# Patient Record
Sex: Female | Born: 1980
Health system: Southern US, Community
[De-identification: ages and names within clinical notes are randomized; demographics above are authoritative.]

## PROBLEM LIST (undated history)

## (undated) DIAGNOSIS — M546 Pain in thoracic spine: Secondary | ICD-10-CM

## (undated) DIAGNOSIS — S61451A Open bite of right hand, initial encounter: Secondary | ICD-10-CM

## (undated) DIAGNOSIS — N63 Unspecified lump in unspecified breast: Secondary | ICD-10-CM

## (undated) DIAGNOSIS — K219 Gastro-esophageal reflux disease without esophagitis: Secondary | ICD-10-CM

---
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
## 2013-09-20 ENCOUNTER — Ambulatory Visit
Admission: RE | Admit: 2013-09-20 | Discharge: 2013-09-20 | Disposition: A | Discharge status: Home / Self Care | Payer: No Typology Code available for payment source | Source: Ambulatory Visit | Attending: Infectious Disease | Admitting: Infectious Disease

## 2013-09-20 ENCOUNTER — Other Ambulatory Visit: Payer: Self-pay | Admitting: Infectious Disease

## 2013-09-20 DIAGNOSIS — R7612 Nonspecific reaction to cell mediated immunity measurement of gamma interferon antigen response without active tuberculosis: Secondary | ICD-10-CM

## 2016-06-25 ENCOUNTER — Ambulatory Visit: Payer: Self-pay | Admitting: Primary Care

## 2016-07-16 ENCOUNTER — Encounter: Payer: Self-pay | Admitting: Primary Care

## 2016-07-16 ENCOUNTER — Ambulatory Visit (INDEPENDENT_AMBULATORY_CARE_PROVIDER_SITE_OTHER): Payer: BLUE CROSS/BLUE SHIELD | Admitting: Primary Care

## 2016-07-16 VITALS — BP 108/76 | HR 63 | Temp 98.1°F | Ht 60.5 in | Wt 112.8 lb

## 2016-07-16 DIAGNOSIS — Z0000 Encounter for general adult medical examination without abnormal findings: Secondary | ICD-10-CM

## 2016-07-16 DIAGNOSIS — N63 Unspecified lump in unspecified breast: Secondary | ICD-10-CM

## 2016-07-16 DIAGNOSIS — Z0001 Encounter for general adult medical examination with abnormal findings: Secondary | ICD-10-CM

## 2016-07-16 NOTE — Progress Notes (Signed)
Subjective:    Patient ID: Heather Park, female    DOB: 12/31/1980, 36 y.o.   MRN: [id number]  HPI  [name] is a 36 year old female who presents today to establish care and and for complete physical.  Will obtain old records.  1) Breast Mass: Noted to left breast for years, more noticeable with menstrual periods but overall seem to be larger. She denies pain, changes in skin texture, unexplained weight loss. She has a family history of breast cancer in her paternal aunt.    Immunizations: -Tetanus: Completed within 10 years.  -Influenza: Completed in 2017   Diet: She endorses a healthy diet. Breakfast: Yogurt, coffee Lunch: Fish, vegetables, rice Dinner: Soup, rice, vegetables Snacks: Nuts Desserts: Rarely  Beverages: Coffee, water  Exercise: She does not routinely exercise Eye exam: Completed years ago, no changes in vision. Dental exam: Completed in February 2018. Pap Smear: Completed three years ago. Due.     Review of Systems  Constitutional: Negative for unexpected weight change.  HENT: Negative for rhinorrhea.   Respiratory: Negative for cough and shortness of breath.   Cardiovascular: Negative for chest pain.  Gastrointestinal: Negative for constipation and diarrhea.  Genitourinary: Negative for difficulty urinating and menstrual problem.       Breast lump on left side.  Musculoskeletal: Negative for arthralgias and myalgias.  Skin: Negative for rash.  Allergic/Immunologic: Negative for environmental allergies.  Neurological: Negative for dizziness, numbness and headaches.  Psychiatric/Behavioral:       She denies concerns for anxiety and depression.        No past medical history on file.   Social History   Social History  . Marital status: Married    Spouse name: N/A  . Number of children: N/A  . Years of education: N/A   Occupational History  . Not on file.   Social History Main Topics  . Smoking status: Never Smoker  . Smokeless  tobacco: Never Used  . Alcohol use Yes  . Drug use: Unknown  . Sexual activity: Not on file   Other Topics Concern  . Not on file   Social History Narrative   Married.   2 children.   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     Past Surgical History:  Procedure Laterality Date  . CESAREAN SECTION     x2    Family History  Problem Relation Age of Onset  . Diabetes Father   . Heart attack Maternal Grandmother   . Breast cancer Paternal Aunt   . Leukemia Maternal Uncle     No Known Allergies  No current outpatient prescriptions on file prior to visit.   No current facility-administered medications on file prior to visit.     BP 108/76   Pulse 63   Temp 98.1 F (36.7 C) (Oral)   Ht 5' 0.5" (1.537 m)   Wt 112 lb 12.8 oz (51.2 kg)   LMP 07/12/2016   SpO2 99%   BMI 21.67 kg/m    Objective:   Physical Exam  Constitutional: She is oriented to person, place, and time. She appears well-nourished.  HENT:  Right Ear: Tympanic membrane and ear canal normal.  Left Ear: Tympanic membrane and ear canal normal.  Nose: Nose normal.  Mouth/Throat: Oropharynx is clear and moist.  Eyes: Conjunctivae and EOM are normal. Pupils are equal, round, and reactive to light.  Neck: Neck supple. No thyromegaly present.  Cardiovascular: Normal rate and  regular rhythm.   No murmur heard. Pulmonary/Chest: Effort normal and breath sounds normal. She has no rales.    Masses to breasts as noted in picture. Dense breast tissue throughout.   Abdominal: Soft. Bowel sounds are normal. There is no tenderness.  Musculoskeletal: Normal range of motion.  Lymphadenopathy:    She has no cervical adenopathy.  Neurological: She is alert and oriented to person, place, and time. She has normal reflexes. No cranial nerve deficit.  Skin: Skin is warm and dry. No rash noted.  Psychiatric: She has a normal mood and affect.          Assessment & Plan:

## 2016-07-16 NOTE — Assessment & Plan Note (Signed)
Immunizations UTD. Pap due, she will reschedule as she is on her menstrual cycle and would like to hold off. Discussed the importance of a healthy diet and regular exercise in order to reduce the risk of other medical diseases. Exam with breast masses as mentioned, otherwise unremarkable. Labs pending. Follow up in 1 year for annual exam.

## 2016-07-16 NOTE — Patient Instructions (Signed)
You will be contacted regarding your mammogram and ultrasounds.  Schedule your Pap and lab work at your earliest convenience. Ensure you are fasting prior to this appointment. You may have water and black coffee.  It's important to improve your diet by increase consumption of fresh vegetables and fruits, whole grains, water.  Ensure you are drinking 64 ounces of water daily.  Start exercising. You should be getting 150 minutes of moderate intensity exercise weekly.  Follow up in 1 year for annual exam or sooner if needed.  It was a pleasure to meet you today! Please don't hesitate to call me with any questions. Welcome to [facility]!

## 2016-07-16 NOTE — Assessment & Plan Note (Signed)
Present for years, increase in size overall. Exam today with masses as noted in PE. Diagnostic mammogram and [place] ordered and are pending.

## 2016-07-16 NOTE — Progress Notes (Signed)
Pre visit review using our clinic review tool, if applicable. No additional management support is needed unless otherwise documented below in the visit note. 

## 2016-07-23 ENCOUNTER — Ambulatory Visit (INDEPENDENT_AMBULATORY_CARE_PROVIDER_SITE_OTHER): Payer: BLUE CROSS/BLUE SHIELD | Admitting: Primary Care

## 2016-07-23 ENCOUNTER — Other Ambulatory Visit (HOSPITAL_COMMUNITY)
Admission: RE | Admit: 2016-07-23 | Discharge: 2016-07-23 | Disposition: A | Discharge status: Home / Self Care | Payer: BLUE CROSS/BLUE SHIELD | Source: Ambulatory Visit | Attending: Primary Care | Admitting: Primary Care

## 2016-07-23 VITALS — BP 106/72 | HR 71 | Temp 98.1°F | Ht 61.0 in | Wt 110.1 lb

## 2016-07-23 DIAGNOSIS — Z124 Encounter for screening for malignant neoplasm of cervix: Secondary | ICD-10-CM | POA: Insufficient documentation

## 2016-07-23 DIAGNOSIS — Z0000 Encounter for general adult medical examination without abnormal findings: Secondary | ICD-10-CM | POA: Diagnosis not present

## 2016-07-23 NOTE — Progress Notes (Signed)
   Subjective:    Patient ID: Heather Park, female    DOB: May 02, 1981, 36 y.o.   MRN: [id number]  HPI  Heather Park is a 36 year old female who presents today for Pap and fasting labs. She completed a CPE on 07/16/16. She denies vaginal discharge, vaginal bleeding, urinary symptoms. She has not complaints.  Review of Systems  Gastrointestinal: Negative for abdominal pain.  Genitourinary: Negative for dysuria, pelvic pain, vaginal bleeding and vaginal discharge.       No past medical history on file.   Social History   Social History  . Marital status: Married    Spouse name: N/A  . Number of children: N/A  . Years of education: N/A   Occupational History  . Not on file.   Social History Main Topics  . Smoking status: Never Smoker  . Smokeless tobacco: Never Used  . Alcohol use Yes  . Drug use: Unknown  . Sexual activity: Not on file   Other Topics Concern  . Not on file   Social History Narrative   Married.   2 children.   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     Past Surgical History:  Procedure Laterality Date  . CESAREAN SECTION     x2    Family History  Problem Relation Age of Onset  . Diabetes Father   . Heart attack Maternal Grandmother   . Breast cancer Paternal Aunt   . Leukemia Maternal Uncle     No Known Allergies  Current Outpatient Prescriptions on File Prior to Visit  Medication Sig Dispense Refill  . Multiple Vitamin (MULTIVITAMIN) tablet Take 1 tablet by mouth daily.     No current facility-administered medications on file prior to visit.     BP 106/72   Pulse 71   Temp 98.1 F (36.7 C) (Oral)   Ht 5\' 1"  (1.549 m)   Wt 110 lb 1.9 oz (50 kg)   LMP 07/12/2016   SpO2 99%   BMI 20.81 kg/m    Objective:   Physical Exam  Constitutional: She appears well-nourished.  Cardiovascular: Normal rate.   Pulmonary/Chest: Effort normal.  Genitourinary: There is no tenderness or lesion on the right  labia. There is no tenderness or lesion on the left labia. Cervix exhibits no motion tenderness and no discharge. Right adnexum displays no tenderness. Left adnexum displays no tenderness. No vaginal discharge found.  Skin: Skin is warm and dry.          Assessment & Plan:

## 2016-07-23 NOTE — Patient Instructions (Signed)
We will notify you of your pap results once received.  Complete lab work prior to leaving today. I will notify you of your results once received.   It was a pleasure to see you today!

## 2016-07-23 NOTE — Addendum Note (Signed)
Addended by: Jacqualin Combes on: 07/23/2016 05:24 PM   Modules accepted: Orders

## 2016-07-23 NOTE — Progress Notes (Signed)
Pre visit review using our clinic review tool, if applicable. No additional management support is needed unless otherwise documented below in the visit note. 

## 2016-07-24 LAB — LIPID PANEL
CHOL/HDL RATIO: 2
Cholesterol: 176 mg/dL (ref 0–200)
HDL: 88.6 mg/dL (ref >39.00)
LDL CALC: 79 mg/dL (ref 0–99)
NONHDL: 87.22
TRIGLYCERIDES: 40 mg/dL (ref 0.0–149.0)
VLDL: 8 mg/dL (ref 0.0–40.0)

## 2016-07-24 LAB — COMPREHENSIVE METABOLIC PANEL
ALT: 16 U/L (ref 0–35)
AST: 19 U/L (ref 0–37)
Albumin: 4.5 g/dL (ref 3.5–5.2)
Alkaline Phosphatase: 46 U/L (ref 39–117)
BUN: 11 mg/dL (ref 6–23)
CHLORIDE: 103 meq/L (ref 96–112)
CO2: 31 meq/L (ref 19–32)
CREATININE: 0.75 mg/dL (ref 0.40–1.20)
Calcium: 9.7 mg/dL (ref 8.4–10.5)
GFR: 93.79 mL/min (ref >60.00)
GLUCOSE: 84 mg/dL (ref 70–99)
Potassium: 4.3 mEq/L (ref 3.5–5.1)
Sodium: 138 mEq/L (ref 135–145)
Total Bilirubin: 0.8 mg/dL (ref 0.2–1.2)
Total Protein: 7.6 g/dL (ref 6.0–8.3)

## 2016-07-25 ENCOUNTER — Ambulatory Visit
Admission: RE | Admit: 2016-07-25 | Discharge: 2016-07-25 | Disposition: A | Discharge status: Home / Self Care | Payer: BLUE CROSS/BLUE SHIELD | Source: Ambulatory Visit | Attending: Primary Care | Admitting: Primary Care

## 2016-07-25 ENCOUNTER — Encounter: Payer: Self-pay | Admitting: *Deleted

## 2016-07-25 ENCOUNTER — Other Ambulatory Visit: Payer: Self-pay | Admitting: Primary Care

## 2016-07-25 DIAGNOSIS — N63 Unspecified lump in unspecified breast: Secondary | ICD-10-CM

## 2016-07-25 DIAGNOSIS — N6310 Unspecified lump in the right breast, unspecified quadrant: Secondary | ICD-10-CM | POA: Diagnosis not present

## 2016-07-25 DIAGNOSIS — N6320 Unspecified lump in the left breast, unspecified quadrant: Secondary | ICD-10-CM | POA: Diagnosis not present

## 2016-07-25 HISTORY — DX: Unspecified lump in unspecified breast: N63.0

## 2016-07-26 ENCOUNTER — Other Ambulatory Visit: Payer: Self-pay | Admitting: Primary Care

## 2016-07-26 DIAGNOSIS — R928 Other abnormal and inconclusive findings on diagnostic imaging of breast: Secondary | ICD-10-CM

## 2016-07-26 DIAGNOSIS — N6320 Unspecified lump in the left breast, unspecified quadrant: Secondary | ICD-10-CM

## 2016-07-26 DIAGNOSIS — N6310 Unspecified lump in the right breast, unspecified quadrant: Secondary | ICD-10-CM

## 2016-07-26 LAB — CYTOLOGY - PAP
Adequacy: ABSENT
Diagnosis: NEGATIVE
HPV: NOT DETECTED

## 2016-07-29 ENCOUNTER — Encounter: Payer: Self-pay | Admitting: *Deleted

## 2016-11-08 ENCOUNTER — Telehealth: Payer: Self-pay | Admitting: Primary Care

## 2016-11-08 DIAGNOSIS — N6459 Other signs and symptoms in breast: Secondary | ICD-10-CM

## 2016-11-08 NOTE — Telephone Encounter (Signed)
Heather Park, please notify patient that we will work on taking care of the referral. [name], is this a general surgery referral?

## 2016-11-08 NOTE — Telephone Encounter (Signed)
Patient was suppose to get a Breast Biopsy done in March.  [facility] said [facility] too [facility] for her to have it done there.  Patient would like a referral sent to [facility].  Patient would like an appointment on 12/24/16.  Patient said if that's not available, she can go on 12/31/16.  She'd like the appointment early in the morning. Patient only wants a left breast biopsy.

## 2016-11-11 NOTE — Telephone Encounter (Signed)
Spoken and notified patient of Kate's comments. Patient verbalized understanding. 

## 2016-11-11 NOTE — Telephone Encounter (Signed)
Referral placed.

## 2016-11-11 NOTE — Addendum Note (Signed)
Addended by: [name] on: 11/11/2016 04:56 PM   Modules accepted: Orders

## 2016-11-11 NOTE — Telephone Encounter (Signed)
Yes Its General Surgery

## 2016-11-12 ENCOUNTER — Telehealth: Payer: Self-pay | Admitting: General Surgery

## 2016-11-12 NOTE — Telephone Encounter (Signed)
LEFT MESSAGE FOR PATIENT TO CALL & SCHEDULE AN APPOINTMENT WITH DR Marjorie Smolder CALL) FOR CAT 4 MAMMO&U/S DONE 07-25-16 @ [facility].REF'D BY Alma Friendly NP(DR K BEDSOLE).PLEASE MAKE SURE PATIENT IS AWARE THIS IS FOR EVAL ONLY UNLESS DETERMINED BY MD.

## 2016-11-15 ENCOUNTER — Encounter: Payer: Self-pay | Admitting: *Deleted

## 2016-11-28 ENCOUNTER — Ambulatory Visit: Payer: Self-pay | Admitting: General Surgery

## 2016-12-19 ENCOUNTER — Telehealth: Payer: Self-pay | Admitting: *Deleted

## 2016-12-19 NOTE — Telephone Encounter (Signed)
Pt left voicemail at Triage. She said she has been feeling really fatigued lately, pt is requesting labs done to see if there is a cause for her fatigue. Pt is requesting to have a CBC and TSH lab done

## 2016-12-19 NOTE — Telephone Encounter (Signed)
Please notify patient that we are sorry to hear that she's feeling so fatigued. We will need to see her in the office for an evaluation and for any labs. We may need to add on some extra labs based off of her visit. Please schedule her at her convenience.

## 2016-12-20 NOTE — Telephone Encounter (Signed)
Spoken and notified patient of Kate's comments. Patient wanted to wait until she get her biopsy results before she see Anda Kraft.  However, patient stated that she wanted to know if it is okay with Anda Kraft to do blood work before she see her. Patient wants discuss the results with Anda Kraft.  Patient is aware that Anda Kraft is out of the office until next week.

## 2016-12-24 ENCOUNTER — Ambulatory Visit (INDEPENDENT_AMBULATORY_CARE_PROVIDER_SITE_OTHER): Payer: BLUE CROSS/BLUE SHIELD | Admitting: General Surgery

## 2016-12-24 ENCOUNTER — Encounter: Payer: Self-pay | Admitting: General Surgery

## 2016-12-24 VITALS — BP 94/62 | HR 82 | Resp 12 | Ht 60.0 in | Wt 113.0 lb

## 2016-12-24 DIAGNOSIS — D242 Benign neoplasm of left breast: Secondary | ICD-10-CM

## 2016-12-24 DIAGNOSIS — N6323 Unspecified lump in the left breast, lower outer quadrant: Secondary | ICD-10-CM

## 2016-12-24 NOTE — Telephone Encounter (Signed)
Spoken and notified patient of Kate's comments. Patient verbalized understanding.  Patient will call back to schedule the appt with Anda Kraft

## 2016-12-24 NOTE — Telephone Encounter (Signed)
Please notify patient that we will complete labs during her visit in the office. Again, I need to see her in the office in order to determine what labs we may require.

## 2016-12-24 NOTE — Telephone Encounter (Signed)
Message left for patient to return my call.  

## 2016-12-24 NOTE — Patient Instructions (Addendum)
The patient is aware to call back for any questions or concerns. Try to find out if Aunt was genetically tested (BRCA).

## 2016-12-24 NOTE — Progress Notes (Signed)
Patient ID: Heather Park, female   DOB: [birth year], 36 y.o.   MRN: [id number]  Chief Complaint  Patient presents with  . Breast Problem    HPI Heather Park is a 36 y.o. female.  who presents for a breast evaluation referred by Alma Friendly NP.  She states she started having intermittent left breast pain since last year. She can feel a knot in the lower left breast as well. She states that it started out the size of a dime and now she can hardly feel it. She states the pain started getting better since January with the herbal medications (from [place]) and vitamin C.  Mammograms with left and right breast ultrasounds done 07-25-16 which showed a right breast mass, which she can not feel. Patient does perform regular self breast checks here recently.   Inverted nipples until she breast fed. She is currently on Amoxil since Monday for tooth pain. She works as a Marine scientist at [employer].  HPI  Past Medical History:  Diagnosis Date  . Breast mass 07/25/2016   palp lump left breast 4 o'clock left breast x 3 years-increases in size with periods    Past Surgical History:  Procedure Laterality Date  . CESAREAN SECTION     x2    Family History  Problem Relation Age of Onset  . Diabetes Father   . Heart attack Maternal Grandmother   . Breast cancer Paternal Aunt 8  . Leukemia Maternal Uncle   . Prostate cancer Paternal Uncle     Social History Social History  Substance Use Topics  . Smoking status: Never Smoker  . Smokeless tobacco: Never Used  . Alcohol use Yes    No Known Allergies  Current Outpatient Prescriptions  Medication Sig Dispense Refill  . amoxicillin (AMOXIL) 500 MG tablet     . Ascorbic Acid (VITAMIN C) 100 MG tablet Take 500 mg by mouth daily.    Marland Kitchen b complex vitamins capsule Take 1 capsule by mouth daily.    Marland Kitchen CALCIUM-MAGNESIUM-ZINC PO Take by mouth daily.    . Multiple Vitamin (MULTIVITAMIN) tablet Take 1 tablet by mouth daily.     No current  facility-administered medications for this visit.     Review of Systems Review of Systems  Constitutional: Negative.   Respiratory: Negative.   Cardiovascular: Negative.     Blood pressure 94/62, pulse 82, resp. rate 12, height 5' (1.524 m), weight 113 lb (51.3 kg), last menstrual period 12/02/2016.  Physical Exam Physical Exam  Constitutional: She is oriented to person, place, and time. She appears well-developed and well-nourished.  HENT:  Mouth/Throat: Oropharynx is clear and moist.  Eyes: Conjunctivae are normal. No scleral icterus.  Neck: Neck supple.  Cardiovascular: Normal rate, regular rhythm and normal heart sounds.   Pulmonary/Chest: Effort normal and breath sounds normal. Right breast exhibits no inverted nipple, no mass, no nipple discharge, no skin change and no tenderness. Left breast exhibits mass. Left breast exhibits no inverted nipple, no nipple discharge, no skin change and no tenderness.    4 o'clock 6 CFN left breast a 1.2 cm nodule  Lymphadenopathy:    She has no cervical adenopathy.    She has no axillary adenopathy.  Neurological: She is alert and oriented to person, place, and time.  Skin: Skin is warm and dry.  Psychiatric: Her behavior is normal.    Data Reviewed Bilateral diagnostic mammograms and bilateral breast ultrasounds dated 07/25/2016 were reviewed.  Left breast an area of palpable mass showed  a 0.8 x 1.6 x 2.4 cm multilobulated hypoechoic mass thought to represent a fibroadenoma.  Right breast ultrasound in the area of mammographic abnormality showed a 3 x 4 x 4 mm lesion ought to represent a complicated cyst versus fibroadenoma. BI-RADS-4.  Assessment    Long-standing left lower outer quadrant breast mass by patient history, significant decrease in size and resolution of tenderness over the last 6 months with herbal medication.  Small mammographic/ultrasound detected lesion of the right breast, nonpalpable.  The radiology report  stated the patient's preference was for biopsy at the time of her evaluation in March.    Plan    Options for management reviewed: 1 ) core biopsy to confirm benign pathology versus 2) excisional biopsy was superficial location to remove the lesion versus 3) observation.  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 Based on review of the ultrasound and her clinical exam, my suspicion for occult malignancy is small, but the appropriateness of reassessment if biopsy is not completed was reviewed.  The small lesion in the right breast can be followed at the same setting with a repeat ultrasound.    Discussed options Follow up in 9 months with office ultrasound. Try to find out if Aunt was genetically tested (BRCA). The patient is aware to call back for any questions or new concerns.   HPI, Physical Exam, Assessment and Plan have been scribed under the direction and in the presence of Robert Bellow, MD. Karie Fetch, RN  I have completed the exam and reviewed the above documentation for accuracy and completeness.  I agree with the above.  Haematologist has been used and any errors in dictation or transcription are unintentional.  Hervey Ard, M.D., F.A.C.S.  Robert Bellow 12/25/2016, 4:43 PM

## 2016-12-25 DIAGNOSIS — D242 Benign neoplasm of left breast: Secondary | ICD-10-CM | POA: Insufficient documentation

## 2017-06-18 ENCOUNTER — Encounter: Payer: Self-pay | Admitting: Primary Care

## 2017-06-18 ENCOUNTER — Ambulatory Visit (INDEPENDENT_AMBULATORY_CARE_PROVIDER_SITE_OTHER): Payer: BLUE CROSS/BLUE SHIELD | Admitting: Primary Care

## 2017-06-18 VITALS — BP 100/58 | HR 88 | Temp 98.3°F | Ht 61.0 in | Wt 116.8 lb

## 2017-06-18 DIAGNOSIS — R5383 Other fatigue: Secondary | ICD-10-CM | POA: Insufficient documentation

## 2017-06-18 LAB — COMPREHENSIVE METABOLIC PANEL
ALBUMIN: 4.4 g/dL (ref 3.5–5.2)
ALK PHOS: 48 U/L (ref 39–117)
ALT: 13 U/L (ref 0–35)
AST: 18 U/L (ref 0–37)
BILIRUBIN TOTAL: 0.6 mg/dL (ref 0.2–1.2)
BUN: 13 mg/dL (ref 6–23)
CO2: 29 mEq/L (ref 19–32)
Calcium: 9.5 mg/dL (ref 8.4–10.5)
Chloride: 105 mEq/L (ref 96–112)
Creatinine, Ser: 0.78 mg/dL (ref 0.40–1.20)
GFR: 89.17 mL/min (ref >60.00)
Glucose, Bld: 108 mg/dL — ABNORMAL HIGH (ref 70–99)
Potassium: 4.5 mEq/L (ref 3.5–5.1)
Sodium: 139 mEq/L (ref 135–145)
TOTAL PROTEIN: 7.8 g/dL (ref 6.0–8.3)

## 2017-06-18 LAB — CBC
HEMATOCRIT: 38.9 % (ref 36.0–46.0)
HEMOGLOBIN: 12.7 g/dL (ref 12.0–15.0)
MCHC: 32.7 g/dL (ref 30.0–36.0)
MCV: 82.2 fl (ref 78.0–100.0)
PLATELETS: 171 10*3/uL (ref 150.0–400.0)
RBC: 4.73 Mil/uL (ref 3.87–5.11)
RDW: 12.9 % (ref 11.5–15.5)
WBC: 6.7 10*3/uL (ref 4.0–10.5)

## 2017-06-18 LAB — VITAMIN D 25 HYDROXY (VIT D DEFICIENCY, FRACTURES): VITD: 32.31 ng/mL (ref 30.00–100.00)

## 2017-06-18 LAB — VITAMIN B12: Vitamin B-12: 709 pg/mL (ref 211–911)

## 2017-06-18 LAB — IBC PANEL
IRON: 144 ug/dL (ref 42–145)
Saturation Ratios: 41.3 % (ref 20.0–50.0)
Transferrin: 249 mg/dL (ref 212.0–360.0)

## 2017-06-18 LAB — TSH: TSH: 0.79 u[IU]/mL (ref 0.35–4.50)

## 2017-06-18 NOTE — Assessment & Plan Note (Signed)
Present for the past one year, improved after taking vitamins OTC. Seems like she's sleeping okay during the night.   Exam today unremarkable.  Check labs including TSH, CBC, Iron panel, CMP, vitamin D and B12.  Consider evaluation for depression if labs negative.

## 2017-06-18 NOTE — Patient Instructions (Signed)
Stop by the lab prior to leaving today. I will notify you of your results once received.   It was a pleasure to see you today!  

## 2017-06-18 NOTE — Progress Notes (Signed)
Subjective:    Patient ID: Heather Park, female    DOB: [birth year], 37 y.o.   MRN: [id number]  HPI  Heather Park is a 37 year old female with a history of anemia who presents today with a chief complaint of fatigue.   Symptoms have been present for the past one year. She has been taking oral iron for about one year since symptoms began. Over the past two months she's noticed increased fatigue, palpitations, dizziness in the morning when waking, pale skin, cold hands and legs. She's since started taking D53, Folic acid, Vitamin C in addition to her Iron. She did start to improve after adding in folic acid, b 12, and vitamin C.   She denies heavy menstrual periods, hair loss, increased stress, chest pain, snoring, shortness of breath. She works as a Marine scientist doing day shift. Later this months she'll be working day shift at one job for three days, then night shift at her new job for three nights. She'll be workingtwo jobs for a total of two months. She's sleeping 7 hours nightly, will wake once during the night on average.   BP Readings from Last 3 Encounters:  06/18/17 (!) 100/58  12/24/16 94/62  07/23/16 106/72     Review of Systems  Constitutional: Positive for fatigue. Negative for fever and unexpected weight change.  Respiratory: Negative for shortness of breath.   Cardiovascular: Positive for palpitations. Negative for chest pain.  Neurological: Positive for dizziness. Negative for syncope and headaches.       Past Medical History:  Diagnosis Date  . Breast mass 07/25/2016   palp lump left breast 4 o'clock left breast x 3 years-increases in size with periods     Social History   Socioeconomic History  . Marital status: Married    Spouse name: Not on file  . Number of children: Not on file  . Years of education: Not on file  . Highest education level: Not on file  Social Needs  . Financial resource strain: Not on file  . Food insecurity - worry: Not on file  . Food  insecurity - inability: Not on file  . Transportation needs - medical: Not on file  . Transportation needs - non-medical: Not on file  Occupational History  . Not on file  Tobacco Use  . Smoking status: Never Smoker  . Smokeless tobacco: Never Used  Substance and Sexual Activity  . Alcohol use: Yes  . Drug use: No  . Sexual activity: Not on file  Other Topics Concern  . Not on file  Social History Narrative   Married.   2 children.   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     Past Surgical History:  Procedure Laterality Date  . CESAREAN SECTION     x2    Family History  Problem Relation Age of Onset  . Diabetes Father   . Heart attack Maternal Grandmother   . Breast cancer Paternal Aunt 40  . Leukemia Maternal Uncle   . Prostate cancer Paternal Uncle     No Known Allergies  Current Outpatient Medications on File Prior to Visit  Medication Sig Dispense Refill  . Ascorbic Acid (VITAMIN C) 100 MG tablet Take 100 mg by mouth daily.     Marland Kitchen b complex vitamins capsule Take 1 capsule by mouth daily.    Marland Kitchen CALCIUM-MAGNESIUM-ZINC PO Take by mouth daily.    . ferrous sulfate 325 (65 FE) MG EC  tablet Take 325 mg by mouth 3 (three) times daily with meals.    Marland Kitchen FOLIC ACID PO Take by mouth.    . Multiple Vitamin (MULTIVITAMIN) tablet Take 1 tablet by mouth daily.     No current facility-administered medications on file prior to visit.     BP (!) 100/58   Pulse 88   Temp 98.3 F (36.8 C) (Oral)   Ht 5\' 1"  (1.549 m)   Wt 116 lb 12 oz (53 kg)   LMP 06/01/2017   SpO2 98%   BMI 22.06 kg/m    Objective:   Physical Exam  Constitutional: She appears well-nourished.  Neck: Neck supple. No thyromegaly present.  Cardiovascular: Normal rate and regular rhythm.  Pulmonary/Chest: Effort normal and breath sounds normal.  Skin: Skin is warm and dry.  Psychiatric: She has a normal mood and affect.          Assessment & Plan:

## 2017-06-25 ENCOUNTER — Other Ambulatory Visit: Payer: BLUE CROSS/BLUE SHIELD

## 2017-06-25 ENCOUNTER — Encounter: Payer: BLUE CROSS/BLUE SHIELD | Admitting: Primary Care

## 2017-07-11 ENCOUNTER — Other Ambulatory Visit: Payer: Self-pay | Admitting: Internal Medicine

## 2017-07-11 ENCOUNTER — Ambulatory Visit
Admission: RE | Admit: 2017-07-11 | Discharge: 2017-07-11 | Disposition: A | Discharge status: Home / Self Care | Payer: No Typology Code available for payment source | Source: Ambulatory Visit | Attending: Internal Medicine | Admitting: Internal Medicine

## 2017-07-11 DIAGNOSIS — A150 Tuberculosis of lung: Secondary | ICD-10-CM

## 2017-09-23 ENCOUNTER — Ambulatory Visit: Payer: BLUE CROSS/BLUE SHIELD | Admitting: General Surgery

## 2017-10-15 ENCOUNTER — Encounter: Payer: Self-pay | Admitting: *Deleted

## 2017-12-29 ENCOUNTER — Telehealth: Payer: Self-pay

## 2017-12-29 NOTE — Telephone Encounter (Signed)
I spoke with Heather Park; Heather Park will come fasting on 01/29/18 and talk with Gentry Fitz NP about possibly doing Vit D levels and cholesterol test.

## 2017-12-29 NOTE — Telephone Encounter (Signed)
PLEASE NOTE: All timestamps contained within this report are represented as [place] Standard Time. CONFIDENTIALTY NOTICE: This fax transmission is intended only for the addressee. It contains information that is legally privileged, confidential or otherwise protected from use or disclosure. If you are not the intended recipient, you are strictly prohibited from reviewing, disclosing, copying using or disseminating any of this information or taking any action in reliance on or regarding this information. If you have received this fax in error, please notify us immediately by telephone so that we can arrange for its return to [place]. Phone: [phone], Toll-Free: [phone], Fax: [phone] Page: 1 of 1 Call Id: [id number] [place] Night - Client Nonclinical Telephone Record [place] Night - Client Client Site [facility] Physician Alma Friendly - NP Contact Type Call Who Is Calling Patient / Member / Family / Caregiver Caller Name [name] Phone Number [phone] Patient Name Heather Park Patient DOB Sep 13, 1980 Call Type Message Only Information Provided Reason for Call Request for General Office Information Initial Comment Caller states has appointment on 9/26 at 8:20 for routine check up. Wants to verify whether blood work needs to be done before. Additional Comment Request call back Call Closed By: Margaretmary Bayley Transaction Date/Time: 12/27/2017 11:26:03 AM (ET)

## 2018-01-29 ENCOUNTER — Encounter: Payer: Self-pay | Admitting: Primary Care

## 2018-01-29 ENCOUNTER — Ambulatory Visit (INDEPENDENT_AMBULATORY_CARE_PROVIDER_SITE_OTHER): Payer: 59 | Admitting: Primary Care

## 2018-01-29 VITALS — BP 96/60 | HR 63 | Temp 97.7°F | Ht 61.0 in | Wt 111.2 lb

## 2018-01-29 DIAGNOSIS — Z0000 Encounter for general adult medical examination without abnormal findings: Secondary | ICD-10-CM

## 2018-01-29 DIAGNOSIS — R5383 Other fatigue: Secondary | ICD-10-CM

## 2018-01-29 DIAGNOSIS — M2550 Pain in unspecified joint: Secondary | ICD-10-CM

## 2018-01-29 LAB — BASIC METABOLIC PANEL
BUN: 12 mg/dL (ref 6–23)
CALCIUM: 9.5 mg/dL (ref 8.4–10.5)
CHLORIDE: 105 meq/L (ref 96–112)
CO2: 29 meq/L (ref 19–32)
CREATININE: 0.78 mg/dL (ref 0.40–1.20)
GFR: 88.86 mL/min (ref >60.00)
Glucose, Bld: 96 mg/dL (ref 70–99)
Potassium: 4.5 mEq/L (ref 3.5–5.1)
Sodium: 139 mEq/L (ref 135–145)

## 2018-01-29 LAB — CBC
HCT: 36.8 % (ref 36.0–46.0)
Hemoglobin: 12.2 g/dL (ref 12.0–15.0)
MCHC: 33.2 g/dL (ref 30.0–36.0)
MCV: 80.5 fl (ref 78.0–100.0)
Platelets: 162 10*3/uL (ref 150.0–400.0)
RBC: 4.57 Mil/uL (ref 3.87–5.11)
RDW: 12.9 % (ref 11.5–15.5)
WBC: 5.9 10*3/uL (ref 4.0–10.5)

## 2018-01-29 LAB — VITAMIN D 25 HYDROXY (VIT D DEFICIENCY, FRACTURES): VITD: 39.59 ng/mL (ref 30.00–100.00)

## 2018-01-29 LAB — LIPID PANEL
CHOL/HDL RATIO: 2
Cholesterol: 162 mg/dL (ref 0–200)
HDL: 71.3 mg/dL (ref >39.00)
LDL Cholesterol: 75 mg/dL (ref 0–99)
NONHDL: 90.46
Triglycerides: 75 mg/dL (ref 0.0–149.0)
VLDL: 15 mg/dL (ref 0.0–40.0)

## 2018-01-29 LAB — URIC ACID: Uric Acid, Serum: 4.7 mg/dL (ref 2.4–7.0)

## 2018-01-29 NOTE — Assessment & Plan Note (Signed)
Chronic to hands, knees, back. Discussed daily stretching. Check uric acid (per patient request). No joint swelling or erythema, doubt gout and RA.

## 2018-01-29 NOTE — Progress Notes (Signed)
Subjective:    Patient ID: Heather Park, female    DOB: 21-Jun-1980, 37 y.o.   MRN: [id number]  HPI  Heather Park is a 37 year old female who presents today for complete physical.  Overall doing well. Does continue to have intermittent fatigue. She is compliant to her vitamin D 1000 units daily. She has noticed arthralgias to her hands, knees, and back. She does have a family history of arthritis. She is concerned about gout and would like her levels checked. She denies joint swelling/erythema.   Immunizations: -Tetanus: Completed within 10 years.  -Influenza: Will get at work   Diet: She endorses a healthy diet Breakfast: Yogurt Lunch: Protein, vegetable, starch Dinner: Protein, vegetables, starch Snacks: None Desserts: Daily Beverages: Coffee, milk, rarely, water  Exercise: She is not exercise Eye exam: No recent exam Dental exam: Completes annually  Pap Smear: Completed in 2018, has copper IUD x 4 years.   BP Readings from Last 3 Encounters:  01/29/18 96/60  06/18/17 (!) 100/58  12/24/16 94/62     Review of Systems  Constitutional: Negative for unexpected weight change.       Intermittent fatigue  HENT: Negative for rhinorrhea.   Respiratory: Negative for cough and shortness of breath.   Cardiovascular: Negative for chest pain.  Gastrointestinal: Negative for constipation and diarrhea.  Genitourinary: Negative for difficulty urinating and menstrual problem.  Musculoskeletal: Positive for arthralgias. Negative for joint swelling.       Chronic intermittent upper back tightness  Skin: Negative for color change and rash.  Allergic/Immunologic: Negative for environmental allergies.  Neurological: Negative for dizziness, numbness and headaches.  Psychiatric/Behavioral: The patient is not nervous/anxious.        Past Medical History:  Diagnosis Date  . Breast mass 07/25/2016   palp lump left breast 4 o'clock left breast x 3 years-increases in size with periods       Social History   Socioeconomic History  . Marital status: Married    Spouse name: Not on file  . Number of children: Not on file  . Years of education: Not on file  . Highest education level: Not on file  Occupational History  . Not on file  Social Needs  . Financial resource strain: Not on file  . Food insecurity:    Worry: Not on file    Inability: Not on file  . Transportation needs:    Medical: Not on file    Non-medical: Not on file  Tobacco Use  . Smoking status: Never Smoker  . Smokeless tobacco: Never Used  Substance and Sexual Activity  . Alcohol use: Yes  . Drug use: No  . Sexual activity: Not on file  Lifestyle  . Physical activity:    Days per week: Not on file    Minutes per session: Not on file  . Stress: Not on file  Relationships  . Social connections:    Talks on phone: Not on file    Gets together: Not on file    Attends religious service: Not on file    Active member of club or organization: Not on file    Attends meetings of clubs or organizations: Not on file    Relationship status: Not on file  . Intimate partner violence:    Fear of current or ex partner: Not on file    Emotionally abused: Not on file    Physically abused: Not on file    Forced sexual activity: Not on file  Other  Topics Concern  . Not on file  Social History Narrative   Married.   2 children.   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     Past Surgical History:  Procedure Laterality Date  . CESAREAN SECTION     x2    Family History  Problem Relation Age of Onset  . Diabetes Father   . Heart attack Maternal Grandmother   . Breast cancer Paternal Aunt 1  . Leukemia Maternal Uncle   . Prostate cancer Paternal Uncle     No Known Allergies  Current Outpatient Medications on File Prior to Visit  Medication Sig Dispense Refill  . Ascorbic Acid (VITAMIN C) 100 MG tablet Take 100 mg by mouth daily.     Marland Kitchen b complex vitamins  capsule Take 1 capsule by mouth daily.    Marland Kitchen CALCIUM-MAGNESIUM-ZINC PO Take by mouth daily.    . ferrous sulfate 325 (65 FE) MG EC tablet Take 325 mg by mouth 3 (three) times daily with meals.    Marland Kitchen FOLIC ACID PO Take by mouth.    . Multiple Vitamin (MULTIVITAMIN) tablet Take 1 tablet by mouth daily.     No current facility-administered medications on file prior to visit.     BP 96/60   Pulse 63   Temp 97.7 F (36.5 C) (Oral)   Ht 5\' 1"  (1.549 m)   Wt 111 lb 4 oz (50.5 kg)   LMP 01/11/2018   SpO2 99%   BMI 21.02 kg/m    Objective:   Physical Exam  Constitutional: She is oriented to person, place, and time. She appears well-nourished.  HENT:  Mouth/Throat: No oropharyngeal exudate.  Eyes: Pupils are equal, round, and reactive to light. EOM are normal.  Neck: Neck supple. No thyromegaly present.  Cardiovascular: Normal rate and regular rhythm.  Respiratory: Effort normal and breath sounds normal.  GI: Soft. Bowel sounds are normal. There is no tenderness.  Musculoskeletal: Normal range of motion.  Neurological: She is alert and oriented to person, place, and time.  Skin: Skin is warm and dry.  Psychiatric: She has a normal mood and affect.           Assessment & Plan:

## 2018-01-29 NOTE — Assessment & Plan Note (Signed)
Td UTD per patient. She will get the influenza vaccination at work. Commended her on an overall healthy diet. Recommended regular exercise for symptoms of fatigue. Exam unremarkable. Labs pending. Follow up in 1 year for CPE.

## 2018-01-29 NOTE — Patient Instructions (Signed)
Continue to work on [employer].  Start exercising. You should be getting 150 minutes of moderate intensity exercise weekly.  Ensure you are consuming 64 ounces of water daily.  Stop by the lab prior to leaving today. I will notify you of your results once received.   It was a pleasure to see you today!   Preventive Care 18-39 Years, Female Preventive care refers to lifestyle choices and visits with your health care provider that can promote health and wellness. What does preventive care include?  A yearly physical exam. This is also called an annual well check.  Dental exams once or twice a year.  Routine eye exams. Ask your health care provider how often you should have your eyes checked.  Personal lifestyle choices, including: ? Daily care of your teeth and gums. ? Regular physical activity. ? Eating a healthy diet. ? Avoiding tobacco and drug use. ? Limiting alcohol use. ? Practicing safe sex. ? Taking vitamin and mineral supplements as recommended by your health care provider. What happens during an annual well check? The services and screenings done by your health care provider during your annual well check will depend on your age, overall health, lifestyle risk factors, and family history of disease. Counseling Your health care provider may ask you questions about your:  Alcohol use.  Tobacco use.  Drug use.  Emotional well-being.  Home and relationship well-being.  Sexual activity.  Eating habits.  Work and work Statistician.  Method of birth control.  Menstrual cycle.  Pregnancy history.  Screening You may have the following tests or measurements:  Height, weight, and BMI.  Diabetes screening. This is done by checking your blood sugar (glucose) after you have not eaten for a while (fasting).  Blood pressure.  Lipid and cholesterol levels. These may be checked every 5 years starting at age 68.  Skin check.  Hepatitis C blood test.  Hepatitis  B blood test.  Sexually transmitted disease (STD) testing.  BRCA-related cancer screening. This may be done if you have a family history of breast, ovarian, tubal, or peritoneal cancers.  Pelvic exam and Pap test. This may be done every 3 years starting at age 35. Starting at age 53, this may be done every 5 years if you have a Pap test in combination with an HPV test.  Discuss your test results, treatment options, and if necessary, the need for more tests with your health care provider. Vaccines Your health care provider may recommend certain vaccines, such as:  Influenza vaccine. This is recommended every year.  Tetanus, diphtheria, and acellular pertussis (Tdap, Td) vaccine. You may need a Td booster every 10 years.  Varicella vaccine. You may need this if you have not been vaccinated.  HPV vaccine. If you are 48 or younger, you may need three doses over 6 months.  Measles, mumps, and rubella (MMR) vaccine. You may need at least one dose of MMR. You may also need a second dose.  Pneumococcal 13-valent conjugate (PCV13) vaccine. You may need this if you have certain conditions and were not previously vaccinated.  Pneumococcal polysaccharide (PPSV23) vaccine. You may need one or two doses if you smoke cigarettes or if you have certain conditions.  Meningococcal vaccine. One dose is recommended if you are age 17-21 years and a first-year college student living in a residence hall, or if you have one of several medical conditions. You may also need additional booster doses.  Hepatitis A vaccine. You may need this if you  have certain conditions or if you travel or work in places where you may be exposed to hepatitis A.  Hepatitis B vaccine. You may need this if you have certain conditions or if you travel or work in places where you may be exposed to hepatitis B.  Haemophilus influenzae type b (Hib) vaccine. You may need this if you have certain risk factors.  Talk to your health care  provider about which screenings and vaccines you need and how often you need them. This information is not intended to replace advice given to you by your health care provider. Make sure you discuss any questions you have with your health care provider. Document Released: 06/18/2001 Document Revised: 01/10/2016 Document Reviewed: 02/21/2015 Elsevier Interactive Patient Education  [employer].

## 2018-01-29 NOTE — Assessment & Plan Note (Signed)
Intermittent, somewhat improved. Repeat CBC and Vitamin D pending.

## 2018-02-18 ENCOUNTER — Telehealth: Payer: Self-pay | Admitting: Primary Care

## 2018-02-18 NOTE — Telephone Encounter (Signed)
Copied from [place] [id number]. Topic: Quick Communication - See Telephone Encounter >> Feb 18, 2018 10:19 AM Rutherford Nail, NT wrote: CRM for notification. See Telephone encounter for: 02/18/18. Patient would like results from 01/29/18 mailed to her. Would also like to know if it can include a mychart activation code. Please advise. Patient states that she does not have mychart at this time.

## 2018-02-20 ENCOUNTER — Encounter: Payer: Self-pay | Admitting: *Deleted

## 2018-02-20 ENCOUNTER — Encounter (INDEPENDENT_AMBULATORY_CARE_PROVIDER_SITE_OTHER): Payer: Self-pay

## 2018-02-20 NOTE — Telephone Encounter (Signed)
Noted. Mailed out for patient as requested.

## 2018-05-08 ENCOUNTER — Ambulatory Visit
Admission: EM | Admit: 2018-05-08 | Discharge: 2018-05-08 | Disposition: A | Discharge status: Home / Self Care | Payer: No Typology Code available for payment source | Attending: Family Medicine | Admitting: Family Medicine

## 2018-05-08 ENCOUNTER — Other Ambulatory Visit: Payer: Self-pay

## 2018-05-08 ENCOUNTER — Encounter: Payer: Self-pay | Admitting: Emergency Medicine

## 2018-05-08 DIAGNOSIS — M546 Pain in thoracic spine: Secondary | ICD-10-CM | POA: Diagnosis not present

## 2018-05-08 DIAGNOSIS — T148XXA Other injury of unspecified body region, initial encounter: Secondary | ICD-10-CM | POA: Insufficient documentation

## 2018-05-08 MED ORDER — METHYLPREDNISOLONE 4 MG PO TBPK: ORAL_TABLET | ORAL | 0 refills | Status: DC

## 2018-05-08 MED ORDER — MELOXICAM 7.5 MG PO TABS: 7.5000 mg | ORAL_TABLET | Freq: Every day | ORAL | 0 refills | Status: DC

## 2018-05-08 MED ORDER — CYCLOBENZAPRINE HCL 10 MG PO TABS: 10.0000 mg | ORAL_TABLET | Freq: Two times a day (BID) | ORAL | 0 refills | Status: DC | PRN

## 2018-05-08 NOTE — ED Provider Notes (Signed)
[facility]    CSN: [id number] Arrival date & time: 05/08/18  1917     History   Chief Complaint Chief Complaint  Patient presents with  . Back Pain    HPI Heather Park is a 38 y.o. female who presents today for evaluation of thoracic back pain ongoing since 05/06/2018.  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  The patient reports his pain is a dull, aching sensation.  Denies any radiation of pain, denies any numbness or tingling to bilateral upper extremities or lower extremities.  She has been taking Tylenol in addition to using lidocaine cream with moderate relief.  She does feel that if she is able to remain upright she has decreased discomfort, increased pain with motion of the thoracic spine.  No surgical history to the spine.  No recent unintentional weight loss, no night sweats.  HPI  Past Medical History:  Diagnosis Date  . Breast mass 07/25/2016   palp lump left breast 4 o'clock left breast x 3 years-increases in size with periods    Patient Active Problem List   Diagnosis Date Noted  . Arthralgia 01/29/2018  . Fatigue 06/18/2017  . Fibroadenoma of left breast 12/25/2016  . Preventative health care 07/16/2016  . Breast mass 07/16/2016    Past Surgical History:  Procedure Laterality Date  . CESAREAN SECTION     x2    OB History    Gravida  2   Para  2   Term      Preterm      AB      Living        SAB      TAB      Ectopic      Multiple      Live Births           Obstetric Comments  1st Menstrual Cycle: 13   1st Pregnancy:  28          Home Medications    Prior to Admission medications   Medication Sig Start Date End Date Taking? Authorizing Provider  Ascorbic Acid (VITAMIN C) 100 MG tablet Take 100 mg by mouth daily.    Yes [provider]  b complex vitamins capsule Take 1 capsule by mouth daily.   Yes [provider]  CALCIUM-MAGNESIUM-ZINC PO Take by mouth daily.   Yes [provider]  ferrous sulfate 325 (65 FE) MG EC tablet Take 325 mg by mouth 3 (three) times daily with meals.   Yes [provider]  FOLIC ACID PO Take by mouth.   Yes [provider]  Multiple Vitamin (MULTIVITAMIN) tablet Take 1 tablet by mouth daily.   Yes [provider]  cyclobenzaprine (FLEXERIL) 10 MG tablet Take 1 tablet (10 mg total) by mouth 2 (two) times daily as needed for muscle spasms. 05/08/18   Lattie Corns, PA-C  meloxicam (MOBIC) 7.5 MG tablet Take 1-2 tablets (7.5-15 mg total) by mouth daily. 05/08/18   Lattie Corns, PA-C  methylPREDNISolone (MEDROL DOSEPAK) 4 MG TBPK tablet Take per package instructions. 05/08/18   Lattie Corns, PA-C    Family History Family History  Problem Relation Age of Onset  . Diabetes Father   . Heart attack Maternal Grandmother   . Breast cancer Paternal Aunt  76  . Leukemia Maternal Uncle   . Healthy Mother   . Prostate cancer Paternal Uncle     Social History Social History   Tobacco Use  . Smoking status: Never Smoker  . Smokeless tobacco: Never Used  Substance Use Topics  . Alcohol use: Yes    Comment: socially  . Drug use: No     Allergies   Avocado and Oxycodone   Review of Systems Review of Systems  Musculoskeletal: Positive for arthralgias, back pain and myalgias.  Neurological: Negative for numbness.  All other systems reviewed and are negative.  Physical Exam Triage Vital Signs ED Triage Vitals [05/08/18 1936]  Enc Vitals Group     BP 105/71     Pulse Rate 85     Resp 16     Temp 98.1 F (36.7 C)     Temp Source Oral     SpO2 100 %     Weight 107 lb (48.5 kg)     Height 5' (1.524 m)     Head Circumference      Peak Flow      Pain Score 2     Pain Loc      Pain Edu?      Excl. in [place]?    No  data found.  Updated Vital Signs BP 105/71 (BP Location: Left Arm)   Pulse 85   Temp 98.1 F (36.7 C) (Oral)   Resp 16   Ht 5' (1.524 m)   Wt 107 lb (48.5 kg)   LMP 04/24/2018 (Approximate)   SpO2 100%   BMI 20.90 kg/m   Visual Acuity Right Eye Distance:   Left Eye Distance:   Bilateral Distance:    Right Eye Near:   Left Eye Near:    Bilateral Near:     Physical Exam Examination of the cervical and thoracic spine demonstrate no open wound, erythema or ecchymosis.  No significant swelling is noted.  The patient is nontender palpation throughout the cervical spine and thoracic spine over the vertebral bodies.  Tenderness with palpation along the paravertebral musculature along the medial aspect of the left scapula.  The patient has full range of motion of the cervical spine, left shoulder and lumbar spine without significant discomfort.  5+/5 strength for resisted abduction and forward flexion of the left shoulder.  Intact to light touch to bilateral upper extremities.  Refill intact to each individual digit.  UC Treatments / Results  Labs (all labs ordered are listed, but only abnormal results are displayed) Labs Reviewed - No data to display  EKG None  Radiology No results found.  Procedures Procedures (including critical care time)  Medications Ordered in UC Medications - No data to display  Initial Impression / Assessment and Plan / UC Course  I have reviewed the triage vital signs and the nursing notes.  Pertinent labs & imaging results that were available during my care of the patient were reviewed by me and considered in my medical decision making (see chart for details).     1.  Treatment options were discussed today with the patient. 2.  Without any tenderness to palpation over the vertebral bodies or trauma or injury I do not feel that a x-ray is needed at this appointment. 3.  The patient was given a prednisone taper in addition to a muscle relaxer and a  prescription for meloxicam to take on a daily basis. 4.  The patient was encouraged to apply heat to the region  pain in the morning and ice at night. 5.  Follow-up on as-needed basis if symptoms continue. Final Clinical Impressions(s) / UC Diagnoses   Final diagnoses:  Acute left-sided thoracic back pain  Muscle strain     Discharge Instructions     -Take medications as prescribed. -Alternate heat and ice for ongoing back pain. -Follow-up if no improvement in pain.   ED Prescriptions    Medication Sig Dispense Auth. Provider   cyclobenzaprine (FLEXERIL) 10 MG tablet Take 1 tablet (10 mg total) by mouth 2 (two) times daily as needed for muscle spasms. 20 tablet Lattie Corns, PA-C   meloxicam (MOBIC) 7.5 MG tablet Take 1-2 tablets (7.5-15 mg total) by mouth daily. 30 tablet Lattie Corns, PA-C   methylPREDNISolone (MEDROL DOSEPAK) 4 MG TBPK tablet Take per package instructions. 21 tablet Lattie Corns, PA-C     Controlled Substance Prescriptions [place] Controlled Substance Registry consulted? Not Applicable   Lattie Corns, PA-C 05/08/18 2016

## 2018-05-08 NOTE — Discharge Instructions (Signed)
-  Take medications as prescribed. -Alternate heat and ice for ongoing back pain. -Follow-up if no improvement in pain.

## 2018-05-08 NOTE — ED Triage Notes (Signed)
Patient taking husband's Flexeril.

## 2018-05-08 NOTE — ED Triage Notes (Signed)
Patient in today c/o back pain in the middle of her back since 05/06/18. Patient denies injury. She states she woke up on 05/06/18 and her back was hurting. Patient denies any urinary symptoms. Patient has tried muscle relaxer, Tylenol, BenGay, heating pad and ginger tea without relief. Patient states the pain is not constant.

## 2018-05-11 ENCOUNTER — Telehealth: Payer: Self-pay

## 2018-05-11 NOTE — Telephone Encounter (Signed)
Noted  

## 2018-05-11 NOTE — Telephone Encounter (Signed)
I will see her then  Will cc to PCP

## 2018-05-11 NOTE — Telephone Encounter (Signed)
[facility] faxed note on 05/09/18; pt seen [facility] on 05/08/18 with back pain and was given flexeril 10 mg, meloxicam 7.5 mg and Medrol dosepak 4 mg; pt was to alternate ice and heat and FU if no improvement. Pt said back pain continues; pt thinks pain may be associated with esophagus when eats. Nexium taken on 05/09/18 and seemed to help back pain. Pt has more back pain when lays on back or certain movements. Pt scheduled appt with Dr Glori Bickers 05/12/18 at 8 AM and if condition changes or worsens prior to appt pt will go to UC. FYI to Dr Glori Bickers.

## 2018-05-12 ENCOUNTER — Ambulatory Visit (INDEPENDENT_AMBULATORY_CARE_PROVIDER_SITE_OTHER)
Admission: RE | Admit: 2018-05-12 | Discharge: 2018-05-12 | Disposition: A | Discharge status: Home / Self Care | Payer: No Typology Code available for payment source | Source: Ambulatory Visit | Attending: Family Medicine | Admitting: Family Medicine

## 2018-05-12 ENCOUNTER — Ambulatory Visit (INDEPENDENT_AMBULATORY_CARE_PROVIDER_SITE_OTHER): Payer: No Typology Code available for payment source | Admitting: Family Medicine

## 2018-05-12 ENCOUNTER — Encounter: Payer: Self-pay | Admitting: Family Medicine

## 2018-05-12 VITALS — BP 98/62 | HR 66 | Temp 97.8°F | Ht 61.0 in | Wt 110.2 lb

## 2018-05-12 DIAGNOSIS — M546 Pain in thoracic spine: Secondary | ICD-10-CM

## 2018-05-12 DIAGNOSIS — K219 Gastro-esophageal reflux disease without esophagitis: Secondary | ICD-10-CM | POA: Insufficient documentation

## 2018-05-12 DIAGNOSIS — R1013 Epigastric pain: Secondary | ICD-10-CM

## 2018-05-12 HISTORY — DX: Pain in thoracic spine: M54.6

## 2018-05-12 MED ORDER — ESOMEPRAZOLE MAGNESIUM 40 MG PO CPDR: 40.0000 mg | DELAYED_RELEASE_CAPSULE | Freq: Every day | ORAL | 3 refills | Status: DC

## 2018-05-12 NOTE — Assessment & Plan Note (Signed)
Pt describes symptoms of acid reflux (regurgitation) worse in throat when lying down  Since December  Now also thoracic back pain that worsens with eating  nexium 20 mg helped- will px 40 mg and see if it helps more Disc diet  Will work up back pain as well

## 2018-05-12 NOTE — Progress Notes (Signed)
Subjective:    Patient ID: Heather Park, female    DOB: [birth year], 38 y.o.   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)  She had a reassuring exam w/o bony tenderness Px prednisone and muscle relaxer and meloxicam  Adv to use heat   Now pain is worse with eating/swallowing   Pain is L side of the spine/thoracic   She stopped prednisone-thinks it made her worse  Flexeril may have helped a bit  Not taking nsaid  Tried nexium -that helps some  Starting to feel more acid reflux in throat   (notes that started after she ate spicy food in December- caused swollen tongue/burning and itching in mouth)   She has been gargling with apple cider   More pain if she lies down  Better if she sits up   No pain in stomach/abd occ nausea No vomiting  No chance she is pregnant   Patient Active Problem List   Diagnosis Date Noted  . Thoracic back pain 05/12/2018  . Dyspepsia 05/12/2018  . Arthralgia 01/29/2018  . Fatigue 06/18/2017  . Fibroadenoma of left breast 12/25/2016  . Preventative health care 07/16/2016  . Breast mass 07/16/2016   Past Medical History:  Diagnosis Date  . Breast mass 07/25/2016   palp lump left breast 4 o'clock left breast x 3 years-increases in size with periods   Past Surgical History:  Procedure Laterality Date  . CESAREAN SECTION     x2   Social History   Tobacco Use  . Smoking status: Never Smoker  . Smokeless tobacco: Never Used  Substance Use Topics  . Alcohol use: Yes    Comment: socially  . Drug use: No   Family History  Problem Relation Age of Onset  . Diabetes Father   . Heart attack Maternal Grandmother   . Breast cancer Paternal Aunt 61  . Leukemia Maternal Uncle   .  Healthy Mother   . Prostate cancer Paternal Uncle    Allergies  Allergen Reactions  . Avocado Itching  . Oxycodone Itching   Current Outpatient Medications on File Prior to Visit  Medication Sig Dispense Refill  . Ascorbic Acid (VITAMIN C) 100 MG tablet Take 100 mg by mouth daily.     Marland Kitchen b complex vitamins capsule Take 1 capsule by mouth daily.    Marland Kitchen CALCIUM-MAGNESIUM-ZINC PO Take by mouth daily.    . cyclobenzaprine (FLEXERIL) 10 MG tablet Take 1 tablet (10 mg total) by mouth 2 (two) times daily as needed for muscle spasms. 20 tablet 0  . Esomeprazole Magnesium (NEXIUM PO) Take 1 tablet by mouth daily as needed.    . ferrous sulfate 325 (65 FE) MG EC tablet Take 325 mg by mouth 3 (three) times daily with meals.    Marland Kitchen FOLIC ACID PO Take by mouth.    . Multiple Vitamin (MULTIVITAMIN) tablet Take 1 tablet by mouth daily.     No current facility-administered medications on file prior to visit.     Review of Systems  Constitutional: Negative for activity change, appetite change, chills, fatigue, fever and unexpected weight change.  HENT: Positive for voice change. Negative for congestion, ear pain, rhinorrhea, sinus pressure,  sore throat and trouble swallowing.        Tongue burning after eating spicy chinese food early in dec  That is resolved   Thoracic back pain with swallowing and eating   Eyes: Negative for pain, redness and visual disturbance.  Respiratory: Negative for cough, choking, chest tightness, shortness of breath and wheezing.   Cardiovascular: Negative for chest pain and palpitations.  Gastrointestinal: Positive for nausea. Negative for abdominal distention, abdominal pain, anal bleeding, blood in stool, constipation, diarrhea, rectal pain and vomiting.  Endocrine: Negative for polydipsia and polyuria.  Genitourinary: Negative for dysuria, frequency and urgency.  Musculoskeletal: Positive for back pain. Negative for arthralgias, gait problem, joint swelling, myalgias and  neck pain.  Skin: Negative for pallor and rash.  Allergic/Immunologic: Negative for environmental allergies.  Neurological: Negative for dizziness, syncope and headaches.  Hematological: Negative for adenopathy. Does not bruise/bleed easily.  Psychiatric/Behavioral: Negative for decreased concentration and dysphoric mood. The patient is not nervous/anxious.        Objective:   Physical Exam Constitutional:      General: She is not in acute distress.    Appearance: Normal appearance. She is normal weight. She is not ill-appearing.  HENT:     Head: Normocephalic and atraumatic.     Mouth/Throat:     Mouth: Mucous membranes are moist.     Pharynx: Oropharynx is clear.  Eyes:     Extraocular Movements: Extraocular movements intact.     Conjunctiva/sclera: Conjunctivae normal.     Pupils: Pupils are equal, round, and reactive to light.  Neck:     Musculoskeletal: Normal range of motion. No neck rigidity or muscular tenderness.     Vascular: No carotid bruit.  Cardiovascular:     Rate and Rhythm: Normal rate and regular rhythm.     Pulses: Normal pulses.  Pulmonary:     Effort: Pulmonary effort is normal. No respiratory distress.     Breath sounds: Normal breath sounds. No wheezing.  Abdominal:     General: Abdomen is flat. Bowel sounds are normal. There is no distension.     Palpations: Abdomen is soft. There is no mass.     Tenderness: There is no abdominal tenderness. There is no right CVA tenderness, left CVA tenderness, guarding or rebound.     Hernia: No hernia is present.  Musculoskeletal: Normal range of motion.        General: Tenderness present. No swelling or deformity.     Comments: Mild tenderness L thoracic musculature medial to scapula No bony tenderness No crepitus  Nl rom   Lymphadenopathy:     Cervical: No cervical adenopathy.  Skin:    General: Skin is warm and dry.     Capillary Refill: Capillary refill takes less than 2 seconds.     Coloration: Skin is  not jaundiced or pale.     Findings: No erythema or rash.  Neurological:     General: No focal deficit present.     Mental Status: She is alert.     Deep Tendon Reflexes: Reflexes normal.  Psychiatric:        Mood and Affect: Mood normal.           Assessment & Plan:   Problem List Items Addressed This Visit      Other   Thoracic back pain - Primary    Rev UC notes- prednisone and nsaid made it worse and nexium improved it  Worse when lying down Worse when eating  "pulsating"  pain as well   cxr - to vis aorta and look for sign of hiatal hernia TS film   Flexeril prn Continue to tx gerd symptoms and dyspepsia Plan to follow after rad rev      Relevant Orders   DG Thoracic Spine W/Swimmers   DG Chest 2 View   Dyspepsia    Pt describes symptoms of acid reflux (regurgitation) worse in throat when lying down  Since December  Now also thoracic back pain that worsens with eating  nexium 20 mg helped- will px 40 mg and see if it helps more Disc diet  Will work up back pain as well

## 2018-05-12 NOTE — Assessment & Plan Note (Signed)
Rev UC notes- prednisone and nsaid made it worse and nexium improved it  Worse when lying down Worse when eating  "pulsating" pain as well   cxr - to vis aorta and look for sign of hiatal hernia TS film   Flexeril prn Continue to tx gerd symptoms and dyspepsia Plan to follow after rad rev

## 2018-05-12 NOTE — Patient Instructions (Signed)
Take the nexium 40 mg daily  Watch diet for things that worsen acid   Use warm compress if helpful   Xray today- chest and upper/mid back   Take the flexeril if it helps   We will make a plan from there

## 2018-05-21 ENCOUNTER — Telehealth: Payer: Self-pay | Admitting: Primary Care

## 2018-05-21 NOTE — Telephone Encounter (Signed)
Pt stated she has a lump in her breast and want to have a ultrasound. Please advise

## 2018-05-22 NOTE — Telephone Encounter (Signed)
Please notify patient that I am happy to evaluate her in the office at her convenience. Please schedule.

## 2018-05-25 NOTE — Telephone Encounter (Signed)
Lvm asking pt to call office 

## 2018-05-28 ENCOUNTER — Encounter: Payer: Self-pay | Admitting: Primary Care

## 2018-05-28 ENCOUNTER — Ambulatory Visit (INDEPENDENT_AMBULATORY_CARE_PROVIDER_SITE_OTHER): Payer: No Typology Code available for payment source | Admitting: Primary Care

## 2018-05-28 ENCOUNTER — Telehealth: Payer: Self-pay | Admitting: Primary Care

## 2018-05-28 VITALS — BP 106/70 | HR 80 | Temp 98.3°F | Ht 61.0 in | Wt 111.5 lb

## 2018-05-28 DIAGNOSIS — N63 Unspecified lump in unspecified breast: Secondary | ICD-10-CM

## 2018-05-28 NOTE — Patient Instructions (Signed)
Stop by the front desk and speak with Heather Park regarding your breast ultrasounds.  It was a pleasure to see you today!

## 2018-05-28 NOTE — Progress Notes (Signed)
Subjective:    Patient ID: Heather Park, female    DOB: [birth year], 38 y.o.   MRN: [id number]  HPI  Heather Park is a 38 year old female with a history of breast mass, family history of breast cancer in her paternal aunt (diagnosed in 55's) who presents today with a chief complaint of breast mass.  She was last evaluated for this in March 2018 with reports of a three year history of left breast mass that increased in size with menstrual cycles then reduces. She underwent diagnostic mammogram and ultrasound at that time which revealed "probably complicated cyst vs fibroadenoma to the right breast at 10 o'clock" region. Also with "probably fibroadenoma to the left breast at 3:30 o'clock". During that time it was recommended she either go follow up imaging or ultrasound guided biopsy bilaterally.   Since her last visit she never completed follow up imaging or ultrasound guided biopsy as she started to notice a decrease in her left mass. She was doing more smoothies and eating a healthier diet at the time. Over the two months she's noticed an increase to the left breast mass.   She denies changes to skin texture, unexplained weight loss.   Review of Systems  Constitutional: Negative for fever.  Skin: Negative for color change and wound.       Bilateral breast masses       Past Medical History:  Diagnosis Date  . Breast mass 07/25/2016   palp lump left breast 4 o'clock left breast x 3 years-increases in size with periods     Social History   Socioeconomic History  . Marital status: Married    Spouse name: Not on file  . Number of children: Not on file  . Years of education: Not on file  . Highest education level: Not on file  Occupational History  . Not on file  Social Needs  . Financial resource strain: Not on file  . Food insecurity:    Worry: Not on file    Inability: Not on file  . Transportation needs:    Medical: Not on file    Non-medical: Not on file  Tobacco Use    . Smoking status: Never Smoker  . Smokeless tobacco: Never Used  Substance and Sexual Activity  . Alcohol use: Yes    Comment: socially  . Drug use: No  . Sexual activity: Not on file  Lifestyle  . Physical activity:    Days per week: Not on file    Minutes per session: Not on file  . Stress: Not on file  Relationships  . Social connections:    Talks on phone: Not on file    Gets together: Not on file    Attends religious service: Not on file    Active member of club or organization: Not on file    Attends meetings of clubs or organizations: Not on file    Relationship status: Not on file  . Intimate partner violence:    Fear of current or ex partner: Not on file    Emotionally abused: Not on file    Physically abused: Not on file    Forced sexual activity: Not on file  Other Topics Concern  . Not on file  Social History Narrative   Married.   2 children.   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     Past Surgical History:  Procedure Laterality Date  . CESAREAN SECTION  x2    Family History  Problem Relation Age of Onset  . Diabetes Father   . Heart attack Maternal Grandmother   . Breast cancer Paternal Aunt 76  . Leukemia Maternal Uncle   . Healthy Mother   . Prostate cancer Paternal Uncle     Allergies  Allergen Reactions  . Avocado Itching  . Oxycodone Itching    Current Outpatient Medications on File Prior to Visit  Medication Sig Dispense Refill  . Ascorbic Acid (VITAMIN C) 100 MG tablet Take 100 mg by mouth daily.     Marland Kitchen b complex vitamins capsule Take 1 capsule by mouth daily.    Marland Kitchen CALCIUM-MAGNESIUM-ZINC PO Take by mouth daily.    . cyclobenzaprine (FLEXERIL) 10 MG tablet Take 1 tablet (10 mg total) by mouth 2 (two) times daily as needed for muscle spasms. 20 tablet 0  . esomeprazole (NEXIUM) 40 MG capsule Take 1 capsule (40 mg total) by mouth daily at 12 noon. 30 capsule 3  . Esomeprazole Magnesium (NEXIUM PO)  Take 1 tablet by mouth daily as needed.    . ferrous sulfate 325 (65 FE) MG EC tablet Take 325 mg by mouth 3 (three) times daily with meals.    Marland Kitchen FOLIC ACID PO Take by mouth.    . Multiple Vitamin (MULTIVITAMIN) tablet Take 1 tablet by mouth daily.     No current facility-administered medications on file prior to visit.     BP 106/70   Pulse 80   Temp 98.3 F (36.8 C) (Oral)   Ht 5\' 1"  (1.549 m)   Wt 111 lb 8 oz (50.6 kg)   LMP 05/25/2018   SpO2 98%   BMI 21.07 kg/m    Objective:   Physical Exam  Constitutional: She appears well-nourished.  Neck: Neck supple.  Cardiovascular: Normal rate.  Respiratory: Effort normal.    Firm mass to left breast at 3-4 o'clock positions, non tender. Also with palpable right breast mass at 1 o'clock position. Non tender. No skin texture changes.  Skin: Skin is warm and dry.           Assessment & Plan:

## 2018-05-28 NOTE — Telephone Encounter (Signed)
Noted, orders placed. 

## 2018-05-28 NOTE — Telephone Encounter (Signed)
Appointment 1/31  Pt aware

## 2018-05-28 NOTE — Telephone Encounter (Signed)
I spoke with jamie @ [facility].  It is time for pt to have dx mammogram please put order in  [facility]

## 2018-05-28 NOTE — Assessment & Plan Note (Signed)
Noted bilaterally on exam today. Patient endorses left mass is enlarging. This seems suspicious.  Orders for bilateral ultrasounds placed, may need repeat mammogram and will order if needed. Patient to be scheduled.

## 2018-06-05 ENCOUNTER — Ambulatory Visit
Admission: RE | Admit: 2018-06-05 | Discharge: 2018-06-05 | Disposition: A | Discharge status: Home / Self Care | Payer: No Typology Code available for payment source | Source: Ambulatory Visit | Attending: Primary Care | Admitting: Primary Care

## 2018-06-05 DIAGNOSIS — N63 Unspecified lump in unspecified breast: Secondary | ICD-10-CM | POA: Insufficient documentation

## 2018-08-05 ENCOUNTER — Telehealth: Payer: Self-pay

## 2018-08-05 ENCOUNTER — Other Ambulatory Visit: Payer: Self-pay

## 2018-08-05 ENCOUNTER — Ambulatory Visit (INDEPENDENT_AMBULATORY_CARE_PROVIDER_SITE_OTHER): Payer: No Typology Code available for payment source | Admitting: Primary Care

## 2018-08-05 ENCOUNTER — Encounter: Payer: Self-pay | Admitting: Primary Care

## 2018-08-05 DIAGNOSIS — R51 Headache: Secondary | ICD-10-CM | POA: Diagnosis not present

## 2018-08-05 DIAGNOSIS — R519 Headache, unspecified: Secondary | ICD-10-CM

## 2018-08-05 NOTE — Progress Notes (Signed)
Subjective:    Patient ID: Heather Park, female    DOB: November 03, 1980, 38 y.o.   MRN: [id number]  HPI  Virtual Visit via Video Note  I connected with Heather Park on 08/05/18 at 3:30 pm by a video enabled telemedicine application and verified that I am speaking with the correct person using two identifiers.   I discussed the limitations of evaluation and management by telemedicine and the availability of in person appointments. The patient expressed understanding and agreed to proceed. She is at home, I am in the office.  History of Present Illness:  Heather Park is a 38 year old female who presents today to report recent headache and chills.   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 His flu test came back negative. She's not sure if he was tested for Covid-19. She was using a surgical mask and regular gown during care for this patient with those symptoms. She called health at work through [facility] and was advised to stay home and monitor symptoms for a few days and also discuss symptoms with her PCP.   On March 26th she developed headaches while at work. The following night she noticed chills at night. She checked her temperature at that time and she had no fever. She's been checking her temperature for the last sevearl nights and denies fevers. Her last episode of chills was on March 29th. She has a light headache and is feeling better. She denies cough, shortness of breath, fevers. 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    Observations/Objective:  Appears well, not sickly. Speaking in complete sentences.  No cough.  Assessment and Plan:  Based off our video visit and going through her story, it does not appear that she has Covid-19 at this time. She has been afebrile through the duration of her symptoms and now. I believe she may  return to work as scheduled.  We discussed to report any symptoms of cough, fever, chills, body aches to both myself and Health at Work. She verbalized understanding.  Follow Up Instructions:  You may return to work as planned. Please notify [facility] at Work and myself if you develop any symptoms of fevers, shortness of breath, cough, etc.  Nice to see you! Heather Bossier, NP-C    I discussed the assessment and treatment plan with the patient. The patient was provided an opportunity to ask questions and all were answered. The patient agreed with the plan and demonstrated an understanding of the instructions.   The patient was advised to call back or seek an in-person evaluation if the symptoms worsen or if the condition fails to improve as anticipated.     Heather Koch, NP    Review of Systems  Constitutional: Negative for chills, fatigue and fever.  HENT: Negative for congestion, sinus pressure and sore throat.   Respiratory: Negative for cough and shortness of breath.   Neurological: Positive for headaches.       Past Medical History:  Diagnosis Date  . Breast mass 07/25/2016   palp lump left breast 4 o'clock left breast x 3 years-increases in size with periods     Social History   Socioeconomic History  . Marital status: Married    Spouse name: Not on file  . Number of children: Not on file  . Years of education: Not on  file  . Highest education level: Not on file  Occupational History  . Not on file  Social Needs  . Financial resource strain: Not on file  . Food insecurity:    Worry: Not on file    Inability: Not on file  . Transportation needs:    Medical: Not on file    Non-medical: Not on file  Tobacco Use  . Smoking status: Never Smoker  . Smokeless tobacco: Never Used  Substance and Sexual Activity  . Alcohol use: Yes    Comment: socially  . Drug use: No  . Sexual activity: Not on file  Lifestyle  . Physical activity:    Days per week: Not on  file    Minutes per session: Not on file  . Stress: Not on file  Relationships  . Social connections:    Talks on phone: Not on file    Gets together: Not on file    Attends religious service: Not on file    Active member of club or organization: Not on file    Attends meetings of clubs or organizations: Not on file    Relationship status: Not on file  . Intimate partner violence:    Fear of current or ex partner: Not on file    Emotionally abused: Not on file    Physically abused: Not on file    Forced sexual activity: Not on file  Other Topics Concern  . Not on file  Social History Narrative   Married.   2 children.   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     Past Surgical History:  Procedure Laterality Date  . CESAREAN SECTION     x2    Family History  Problem Relation Age of Onset  . Diabetes Father   . Heart attack Maternal Grandmother   . Breast cancer Paternal Aunt 61  . Leukemia Maternal Uncle   . Healthy Mother   . Prostate cancer Paternal Uncle     Allergies  Allergen Reactions  . Avocado Itching  . Oxycodone Itching    Current Outpatient Medications on File Prior to Visit  Medication Sig Dispense Refill  . Ascorbic Acid (VITAMIN C) 100 MG tablet Take 100 mg by mouth daily.     Heather Kitchen b complex vitamins capsule Take 1 capsule by mouth daily.    Heather Kitchen CALCIUM-MAGNESIUM-ZINC PO Take by mouth daily.    . cyclobenzaprine (FLEXERIL) 10 MG tablet Take 1 tablet (10 mg total) by mouth 2 (two) times daily as needed for muscle spasms. 20 tablet 0  . esomeprazole (NEXIUM) 40 MG capsule Take 1 capsule (40 mg total) by mouth daily at 12 noon. 30 capsule 3  . Esomeprazole Magnesium (NEXIUM PO) Take 1 tablet by mouth daily as needed.    . ferrous sulfate 325 (65 FE) MG EC tablet Take 325 mg by mouth 3 (three) times daily with meals.    Heather Kitchen FOLIC ACID PO Take by mouth.    . Multiple Vitamin (MULTIVITAMIN) tablet Take 1 tablet by mouth daily.      No current facility-administered medications on file prior to visit.     LMP 07/18/2018    Objective:   Physical Exam  Constitutional: She is oriented to person, place, and time. She appears well-nourished. She does not have a sickly appearance. She does not appear ill.  Respiratory: Effort normal.  Neurological: She is alert and oriented to person, place, and time.  Psychiatric:  She has a normal mood and affect.           Assessment & Plan:  Headache/Chills:  Based off our video visit and going through her story, it does not appear that she has Covid-19 at this time. She has been afebrile through the duration of her symptoms and now. I believe she may return to work as scheduled.  We discussed to report any symptoms of cough, fever, chills, body aches to both myself and [facility] at [facility]. She verbalized understanding.  Heather Koch, NP

## 2018-08-05 NOTE — Patient Instructions (Signed)
You may return to work as planned. Please notify [facility] and myself if you develop any symptoms of fevers, shortness of breath, cough, etc.  Nice to see you! Allie Bossier, NP-C

## 2018-08-11 NOTE — Telephone Encounter (Signed)
Pt had virtual visit on 08/05/18.

## 2018-11-12 ENCOUNTER — Telehealth: Payer: Self-pay | Admitting: Primary Care

## 2018-11-12 NOTE — Telephone Encounter (Signed)
It appears that she is due for her annual physical in September, which she like to come in for a physical at that time and have labs done?  Otherwise there is no reason for labs to be ordered at this time.

## 2018-11-12 NOTE — Telephone Encounter (Signed)
Pt put in following request. I do not see orders. Please advise:  With Provider: [name], NP [facility] [place]  Preferred Date Range: 11/23/2018 - 12/01/2018  Preferred Times: Any Time  Reason for visit: Request an Appointment  Comments: Routine blood draw screening including thyroid fxn. Thnx

## 2018-11-24 ENCOUNTER — Other Ambulatory Visit: Payer: Self-pay

## 2018-11-24 ENCOUNTER — Ambulatory Visit (INDEPENDENT_AMBULATORY_CARE_PROVIDER_SITE_OTHER): Payer: No Typology Code available for payment source | Admitting: Family Medicine

## 2018-11-24 ENCOUNTER — Encounter: Payer: Self-pay | Admitting: Family Medicine

## 2018-11-24 ENCOUNTER — Telehealth: Payer: Self-pay

## 2018-11-24 VITALS — BP 114/62 | HR 66 | Temp 98.3°F | Ht 61.0 in | Wt 110.4 lb

## 2018-11-24 DIAGNOSIS — S61451A Open bite of right hand, initial encounter: Secondary | ICD-10-CM | POA: Diagnosis not present

## 2018-11-24 DIAGNOSIS — W540XXA Bitten by dog, initial encounter: Secondary | ICD-10-CM

## 2018-11-24 DIAGNOSIS — Z23 Encounter for immunization: Secondary | ICD-10-CM

## 2018-11-24 HISTORY — DX: Open bite of right hand, initial encounter: S61.451A

## 2018-11-24 HISTORY — DX: Bitten by dog, initial encounter: W54.0XXA

## 2018-11-24 NOTE — Progress Notes (Signed)
Subjective:    Patient ID: Heather Park, female    DOB: 12-08-1980, 38 y.o.   MRN: [id number]  HPI 39 yo pt of NP Clark here for dog bite on R hand  She was petting a friend's puppy -Saturday  Let it bleed a little and then it stopped She washed it with soap and water   Not an aggressive bite -just a playful bite  Happy puppy  23 months old - immunized   No redness No swelling No pain   R hand-palm  Not limited in terms of her grip    Tetanus shot  Tdap - 7 years ago   She is now nervous about dogs in general   Patient Active Problem List   Diagnosis Date Noted  . Dog bite of right hand 11/24/2018  . Thoracic back pain 05/12/2018  . Dyspepsia 05/12/2018  . Arthralgia 01/29/2018  . Fatigue 06/18/2017  . Fibroadenoma of left breast 12/25/2016  . Preventative health care 07/16/2016  . Breast mass 07/16/2016   Past Medical History:  Diagnosis Date  . Breast mass 07/25/2016   palp lump left breast 4 o'clock left breast x 3 years-increases in size with periods   Past Surgical History:  Procedure Laterality Date  . CESAREAN SECTION     x2   Social History   Tobacco Use  . Smoking status: Never Smoker  . Smokeless tobacco: Never Used  Substance Use Topics  . Alcohol use: Yes    Comment: socially  . Drug use: No   Family History  Problem Relation Age of Onset  . Diabetes Father   . Heart attack Maternal Grandmother   . Breast cancer Paternal Aunt 16  . Leukemia Maternal Uncle   . Healthy Mother   . Prostate cancer Paternal Uncle    Allergies  Allergen Reactions  . Avocado Itching  . Oxycodone Itching   Current Outpatient Medications on File Prior to Visit  Medication Sig Dispense Refill  . Ascorbic Acid (VITAMIN C) 100 MG tablet Take 100 mg by mouth daily.     Marland Kitchen b complex vitamins capsule Take 1 capsule by mouth daily.    Marland Kitchen CALCIUM-MAGNESIUM-ZINC PO Take by mouth daily.    . cyclobenzaprine (FLEXERIL) 10 MG tablet Take 1 tablet (10 mg total) by  mouth 2 (two) times daily as needed for muscle spasms. 20 tablet 0  . esomeprazole (NEXIUM) 40 MG capsule Take 1 capsule (40 mg total) by mouth daily at 12 noon. 30 capsule 3  . Esomeprazole Magnesium (NEXIUM PO) Take 1 tablet by mouth daily as needed.    . ferrous sulfate 325 (65 FE) MG EC tablet Take 325 mg by mouth 3 (three) times daily with meals.    Marland Kitchen FOLIC ACID PO Take by mouth.    . Multiple Vitamin (MULTIVITAMIN) tablet Take 1 tablet by mouth daily.     No current facility-administered medications on file prior to visit.     Review of Systems  Constitutional: Negative for activity change, appetite change, fatigue, fever and unexpected weight change.  HENT: Negative for congestion, ear pain, rhinorrhea, sinus pressure and sore throat.   Eyes: Negative for pain, redness and visual disturbance.  Respiratory: Negative for cough, shortness of breath and wheezing.   Cardiovascular: Negative for chest pain and palpitations.  Gastrointestinal: Negative for abdominal pain, blood in stool, constipation and diarrhea.  Endocrine: Negative for polydipsia and polyuria.  Genitourinary: Negative for dysuria, frequency and urgency.  Musculoskeletal: Negative for arthralgias,  back pain and myalgias.  Skin: Positive for wound. Negative for pallor and rash.  Allergic/Immunologic: Negative for environmental allergies.  Neurological: Negative for dizziness, syncope and headaches.  Hematological: Negative for adenopathy. Does not bruise/bleed easily.  Psychiatric/Behavioral: Negative for decreased concentration and dysphoric mood. The patient is not nervous/anxious.        Objective:   Physical Exam Constitutional:      General: She is not in acute distress.    Appearance: Normal appearance. She is normal weight. She is not ill-appearing.  HENT:     Mouth/Throat:     Mouth: Mucous membranes are moist.  Eyes:     Conjunctiva/sclera: Conjunctivae normal.     Pupils: Pupils are equal, round, and  reactive to light.  Cardiovascular:     Rate and Rhythm: Normal rate and regular rhythm.     Pulses: Normal pulses.  Pulmonary:     Effort: Pulmonary effort is normal. No respiratory distress.     Breath sounds: Normal breath sounds. No wheezing or rales.  Musculoskeletal:     Comments: No bony or soft tissue tenderness  Skin:    General: Skin is warm and dry.     Findings: No erythema or rash.     Comments: 2-3 mm healed puncture site on inferior R palm  No erythema or swelling or tenderness No drainage   Neurological:     Mental Status: She is alert.     Sensory: No sensory deficit.  Psychiatric:        Mood and Affect: Mood normal.           Assessment & Plan:   Problem List Items Addressed This Visit      Other   Dog bite of right hand    Small almost completely healed puncture wound of R palm  No s/s of infection Update tetanus imm today  Known dog- suspect immunized but pt will double check with family Not an aggressive bite-(puppy behavior)        Relevant Orders   Tdap vaccine greater than or equal to 7yo IM (Completed)    Other Visit Diagnoses    Need for Tdap vaccination    -  Primary   Relevant Orders   Tdap vaccine greater than or equal to 7yo IM (Completed)

## 2018-11-24 NOTE — Telephone Encounter (Signed)
On 11/21/18 pt was bit by 36 wk old puppy that is very playful. Not sure puppy has had his shots. Pt was bit on palm of rt hand,no drainage and almost healed per pt; pt wanted to be seen; last tdap per pt was 7 yrs ago. Pt scheduled in office appt today at 3pm with Dr Glori Bickers. Pt has no covid symptoms, no travel and no known exposure to + covid.

## 2018-11-24 NOTE — Assessment & Plan Note (Signed)
Small almost completely healed puncture wound of R palm  No s/s of infection Update tetanus imm today  Known dog- suspect immunized but pt will double check with family Not an aggressive bite-(puppy behavior)

## 2018-11-24 NOTE — Patient Instructions (Signed)
Tdap vaccine today   Double check the immunization status of the dog that bit you (since you know the ownder) Bite looks good-not infected  Keep clean with soap and water   If it gets red or swollen let us know

## 2018-12-07 NOTE — Telephone Encounter (Signed)
Per chart review tab pt was seen on 11/24/18.

## 2018-12-24 DIAGNOSIS — Z0000 Encounter for general adult medical examination without abnormal findings: Secondary | ICD-10-CM

## 2019-02-05 ENCOUNTER — Telehealth: Payer: Self-pay

## 2019-02-05 NOTE — Telephone Encounter (Signed)
Left detailed VM w COVID screen and back door lab info and front door info   

## 2019-02-09 ENCOUNTER — Other Ambulatory Visit (INDEPENDENT_AMBULATORY_CARE_PROVIDER_SITE_OTHER): Payer: No Typology Code available for payment source

## 2019-02-09 DIAGNOSIS — Z0000 Encounter for general adult medical examination without abnormal findings: Secondary | ICD-10-CM

## 2019-02-09 LAB — COMPREHENSIVE METABOLIC PANEL
ALT: 15 U/L (ref 0–35)
AST: 17 U/L (ref 0–37)
Albumin: 4.3 g/dL (ref 3.5–5.2)
Alkaline Phosphatase: 55 U/L (ref 39–117)
BUN: 11 mg/dL (ref 6–23)
CO2: 28 mEq/L (ref 19–32)
Calcium: 9.1 mg/dL (ref 8.4–10.5)
Chloride: 103 mEq/L (ref 96–112)
Creatinine, Ser: 0.75 mg/dL (ref 0.40–1.20)
GFR: 86.98 mL/min (ref >60.00)
Glucose, Bld: 94 mg/dL (ref 70–99)
Potassium: 4.2 mEq/L (ref 3.5–5.1)
Sodium: 137 mEq/L (ref 135–145)
Total Bilirubin: 0.5 mg/dL (ref 0.2–1.2)
Total Protein: 7.2 g/dL (ref 6.0–8.3)

## 2019-02-09 LAB — VITAMIN D 25 HYDROXY (VIT D DEFICIENCY, FRACTURES): VITD: 34 ng/mL (ref 30.00–100.00)

## 2019-02-09 LAB — CBC
HCT: 39.1 % (ref 36.0–46.0)
Hemoglobin: 12.8 g/dL (ref 12.0–15.0)
MCHC: 32.9 g/dL (ref 30.0–36.0)
MCV: 81.5 fl (ref 78.0–100.0)
Platelets: 184 10*3/uL (ref 150.0–400.0)
RBC: 4.79 Mil/uL (ref 3.87–5.11)
RDW: 12.6 % (ref 11.5–15.5)
WBC: 8.1 10*3/uL (ref 4.0–10.5)

## 2019-02-09 LAB — LIPID PANEL
Cholesterol: 168 mg/dL (ref 0–200)
HDL: 73.6 mg/dL (ref >39.00)
LDL Cholesterol: 83 mg/dL (ref 0–99)
NonHDL: 94.03
Total CHOL/HDL Ratio: 2
Triglycerides: 56 mg/dL (ref 0.0–149.0)
VLDL: 11.2 mg/dL (ref 0.0–40.0)

## 2019-02-09 LAB — TSH: TSH: 0.73 u[IU]/mL (ref 0.35–4.50)

## 2019-02-12 ENCOUNTER — Other Ambulatory Visit: Payer: Self-pay

## 2019-02-12 ENCOUNTER — Ambulatory Visit (INDEPENDENT_AMBULATORY_CARE_PROVIDER_SITE_OTHER): Payer: No Typology Code available for payment source | Admitting: Primary Care

## 2019-02-12 ENCOUNTER — Encounter: Payer: Self-pay | Admitting: Primary Care

## 2019-02-12 VITALS — BP 116/70 | HR 76 | Temp 98.0°F | Ht 61.0 in | Wt 112.8 lb

## 2019-02-12 DIAGNOSIS — Z0000 Encounter for general adult medical examination without abnormal findings: Secondary | ICD-10-CM

## 2019-02-12 DIAGNOSIS — R1013 Epigastric pain: Secondary | ICD-10-CM | POA: Diagnosis not present

## 2019-02-12 DIAGNOSIS — Z30431 Encounter for routine checking of intrauterine contraceptive device: Secondary | ICD-10-CM

## 2019-02-12 MED ORDER — FAMOTIDINE 20 MG PO TABS: 20.0000 mg | ORAL_TABLET | Freq: Every day | ORAL | 0 refills | Status: DC

## 2019-02-12 NOTE — Progress Notes (Signed)
Subjective:    Patient ID: Heather Park, female    DOB: 15-Dec-1980, 38 y.o.   MRN: [id number]  HPI  Heather Park is a 38 year old female who presents today for complete physical.  Immunizations: -Tetanus: Completed in 2020 -Influenza: Due, she declines for November  Diet: She endorses a healthy diet. Mostly eats meat, vegetables, rice. Desserts several times weekly. She is drinking hot tea, coffee Exercise: She is walking once weekly  Eye exam: No recent exam Dental exam: Completes annually  Pap Smear: Completed in 2018  BP Readings from Last 3 Encounters:  02/12/19 116/70  11/24/18 114/62  05/28/18 106/70      Review of Systems  Constitutional: Negative for unexpected weight change.  HENT: Negative for rhinorrhea.   Respiratory: Negative for cough and shortness of breath.   Cardiovascular: Negative for chest pain.  Gastrointestinal: Negative for constipation and diarrhea.  Genitourinary: Negative for difficulty urinating and menstrual problem.  Musculoskeletal: Negative for arthralgias and myalgias.  Skin: Negative for rash.  Allergic/Immunologic: Negative for environmental allergies.  Neurological: Negative for dizziness and headaches.  Psychiatric/Behavioral: The patient is not nervous/anxious.        Past Medical History:  Diagnosis Date  . Breast mass 07/25/2016   palp lump left breast 4 o'clock left breast x 3 years-increases in size with periods     Social History   Socioeconomic History  . Marital status: Married    Spouse name: Not on file  . Number of children: Not on file  . Years of education: Not on file  . Highest education level: Not on file  Occupational History  . Not on file  Social Needs  . Financial resource strain: Not on file  . Food insecurity    Worry: Not on file    Inability: Not on file  . Transportation needs    Medical: Not on file    Non-medical: Not on file  Tobacco Use  . Smoking status: Never Smoker  . Smokeless  tobacco: Never Used  Substance and Sexual Activity  . Alcohol use: Yes    Comment: socially  . Drug use: No  . Sexual activity: Not on file  Lifestyle  . Physical activity    Days per week: Not on file    Minutes per session: Not on file  . Stress: Not on file  Relationships  . Social Herbalist on phone: Not on file    Gets together: Not on file    Attends religious service: Not on file    Active member of club or organization: Not on file    Attends meetings of clubs or organizations: Not on file    Relationship status: Not on file  . Intimate partner violence    Fear of current or ex partner: Not on file    Emotionally abused: Not on file    Physically abused: Not on file    Forced sexual activity: Not on file  Other Topics Concern  . Not on file  Social History Narrative   Married.   2 children.   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     Past Surgical History:  Procedure Laterality Date  . CESAREAN SECTION     x2    Family History  Problem Relation Age of Onset  . Diabetes Father   . Heart attack Maternal Grandmother   . Breast cancer Paternal Aunt 47  . Leukemia Maternal  Uncle   . Healthy Mother   . Prostate cancer Paternal Uncle     Allergies  Allergen Reactions  . Avocado Itching  . Oxycodone Itching    Current Outpatient Medications on File Prior to Visit  Medication Sig Dispense Refill  . Ascorbic Acid (VITAMIN C) 100 MG tablet Take 100 mg by mouth daily.     Marland Kitchen b complex vitamins capsule Take 1 capsule by mouth daily.    Marland Kitchen CALCIUM-MAGNESIUM-ZINC PO Take by mouth daily.    . Esomeprazole Magnesium (NEXIUM PO) Take 1 tablet by mouth daily as needed.    . ferrous sulfate 325 (65 FE) MG EC tablet Take 325 mg by mouth 3 (three) times daily with meals.    Marland Kitchen FOLIC ACID PO Take by mouth.    . Multiple Vitamin (MULTIVITAMIN) tablet Take 1 tablet by mouth daily.     No current facility-administered  medications on file prior to visit.     BP 116/70   Pulse 76   Temp 98 F (36.7 C) (Temporal)   Ht 5\' 1"  (1.549 m)   Wt 112 lb 12 oz (51.1 kg)   LMP 01/31/2019   SpO2 98%   BMI 21.30 kg/m    Objective:   Physical Exam  Constitutional: She is oriented to person, place, and time. She appears well-nourished.  HENT:  Right Ear: Tympanic membrane and ear canal normal.  Left Ear: Tympanic membrane and ear canal normal.  Mouth/Throat: Oropharynx is clear and moist.  Eyes: Pupils are equal, round, and reactive to light. EOM are normal.  Neck: Neck supple.  Cardiovascular: Normal rate and regular rhythm.  Respiratory: Effort normal and breath sounds normal.  GI: Soft. Bowel sounds are normal. There is no abdominal tenderness.  Musculoskeletal: Normal range of motion.  Neurological: She is alert and oriented to person, place, and time.  Skin: Skin is warm and dry.  Psychiatric: She has a normal mood and affect.           Assessment & Plan:

## 2019-02-12 NOTE — Assessment & Plan Note (Signed)
Immunizations UTD, she will get flu shot at work. Pap smear UTD. Encouraged regular exercise, healthy diet. Exam today unremarkable. Labs reviewed. Follow up in 1 year.

## 2019-02-12 NOTE — Patient Instructions (Addendum)
Complete the course of Nexium then transition over to famotidine (Pepcid) daily for heartburn.  Start exercising. You should be getting 150 minutes of moderate intensity exercise weekly.  It's important to improve your diet by reducing consumption of fast food, fried food, processed snack foods, sugary drinks. Increase consumption of fresh vegetables and fruits, whole grains, water.  Ensure you are drinking 64 ounces of water daily.  You will be contacted regarding your referral to GYN for the IUD removal.  Please let us know if you have not been contacted within one week.   Please update me via My Chart regarding your heartburn.   It was a pleasure to see you today!   Preventive Care 53-1 Years Old, Female Preventive care refers to visits with your health care provider and lifestyle choices that can promote health and wellness. This includes:  A yearly physical exam. This may also be called an annual well check.  Regular dental visits and eye exams.  Immunizations.  Screening for certain conditions.  Healthy lifestyle choices, such as eating a healthy diet, getting regular exercise, not using drugs or products that contain nicotine and tobacco, and limiting alcohol use. What can I expect for my preventive care visit? Physical exam Your health care provider will check your:  Height and weight. This may be used to calculate body mass index (BMI), which tells if you are at a healthy weight.  Heart rate and blood pressure.  Skin for abnormal spots. Counseling Your health care provider may ask you questions about your:  Alcohol, tobacco, and drug use.  Emotional well-being.  Home and relationship well-being.  Sexual activity.  Eating habits.  Work and work Statistician.  Method of birth control.  Menstrual cycle.  Pregnancy history. What immunizations do I need?  Influenza (flu) vaccine  This is recommended every year. Tetanus, diphtheria, and pertussis (Tdap)  vaccine  You may need a Td booster every 10 years. Varicella (chickenpox) vaccine  You may need this if you have not been vaccinated. Human papillomavirus (HPV) vaccine  If recommended by your health care provider, you may need three doses over 6 months. Measles, mumps, and rubella (MMR) vaccine  You may need at least one dose of MMR. You may also need a second dose. Meningococcal conjugate (MenACWY) vaccine  One dose is recommended if you are age 19-21 years and a first-year college student living in a residence hall, or if you have one of several medical conditions. You may also need additional booster doses. Pneumococcal conjugate (PCV13) vaccine  You may need this if you have certain conditions and were not previously vaccinated. Pneumococcal polysaccharide (PPSV23) vaccine  You may need one or two doses if you smoke cigarettes or if you have certain conditions. Hepatitis A vaccine  You may need this if you have certain conditions or if you travel or work in places where you may be exposed to hepatitis A. Hepatitis B vaccine  You may need this if you have certain conditions or if you travel or work in places where you may be exposed to hepatitis B. Haemophilus influenzae type b (Hib) vaccine  You may need this if you have certain conditions. You may receive vaccines as individual doses or as more than one vaccine together in one shot (combination vaccines). Talk with your health care provider about the risks and benefits of combination vaccines. What tests do I need?  Blood tests  Lipid and cholesterol levels. These may be checked every 5 years starting at  age 93.  Hepatitis C test.  Hepatitis B test. Screening  Diabetes screening. This is done by checking your blood sugar (glucose) after you have not eaten for a while (fasting).  Sexually transmitted disease (STD) testing.  BRCA-related cancer screening. This may be done if you have a family history of breast,  ovarian, tubal, or peritoneal cancers.  Pelvic exam and Pap test. This may be done every 3 years starting at age 58. Starting at age 37, this may be done every 5 years if you have a Pap test in combination with an HPV test. Talk with your health care provider about your test results, treatment options, and if necessary, the need for more tests. Follow these instructions at home: Eating and drinking   Eat a diet that includes fresh fruits and vegetables, whole grains, lean protein, and low-fat dairy.  Take vitamin and mineral supplements as recommended by your health care provider.  Do not drink alcohol if: ? Your health care provider tells you not to drink. ? You are pregnant, may be pregnant, or are planning to become pregnant.  If you drink alcohol: ? Limit how much you have to 0-1 drink a day. ? Be aware of how much alcohol is in your drink. In the [place] one drink equals one 12 oz bottle of beer (355 mL), one 5 oz glass of wine (148 mL), or one 1 oz glass of hard liquor (44 mL). Lifestyle  Take daily care of your teeth and gums.  Stay active. Exercise for at least 30 minutes on 5 or more days each week.  Do not use any products that contain nicotine or tobacco, such as cigarettes, e-cigarettes, and chewing tobacco. If you need help quitting, ask your health care provider.  If you are sexually active, practice safe sex. Use a condom or other form of birth control (contraception) in order to prevent pregnancy and STIs (sexually transmitted infections). If you plan to become pregnant, see your health care provider for a preconception visit. What's next?  Visit your health care provider once a year for a well check visit.  Ask your health care provider how often you should have your eyes and teeth checked.  Stay up to date on all vaccines. This information is not intended to replace advice given to you by your health care provider. Make sure you discuss any questions you have with  your health care provider. Document Released: 06/18/2001 Document Revised: 01/01/2018 Document Reviewed: 01/01/2018 Elsevier Patient Education  2020 [place] for Gastroesophageal Reflux Disease, Adult When you have gastroesophageal reflux disease (GERD), the foods you eat and your eating habits are very important. Choosing the right foods can help ease your discomfort. Think about working with a nutrition specialist (dietitian) to help you make good choices. What are tips for following this plan?  Meals  Choose healthy foods that are low in fat, such as fruits, vegetables, whole grains, low-fat dairy products, and lean meat, fish, and poultry.  Eat small meals often instead of 3 large meals a day. Eat your meals slowly, and in a place where you are relaxed. Avoid bending over or lying down until 2-3 hours after eating.  Avoid eating meals 2-3 hours before bed.  Avoid drinking a lot of liquid with meals.  Cook foods using methods other than frying. Bake, grill, or broil food instead.  Avoid or limit: ? Chocolate. ? Peppermint or spearmint. ? Alcohol. ? Pepper. ? Black and decaffeinated coffee. ?  Black and decaffeinated tea. ? Bubbly (carbonated) soft drinks. ? Caffeinated energy drinks and soft drinks.  Limit high-fat foods such as: ? Fatty meat or fried foods. ? Whole milk, cream, butter, or ice cream. ? Nuts and nut butters. ? Pastries, donuts, and sweets made with butter or shortening.  Avoid foods that cause symptoms. These foods may be different for everyone. Common foods that cause symptoms include: ? Tomatoes. ? Oranges, lemons, and limes. ? Peppers. ? Spicy food. ? Onions and garlic. ? Vinegar. Lifestyle  Maintain a healthy weight. Ask your doctor what weight is healthy for you. If you need to lose weight, work with your doctor to do so safely.  Exercise for at least 30 minutes for 5 or more days each week, or as told by your  doctor.  Wear loose-fitting clothes.  Do not smoke. If you need help quitting, ask your doctor.  Sleep with the head of your bed higher than your feet. Use a wedge under the mattress or blocks under the bed frame to raise the head of the bed. Summary  When you have gastroesophageal reflux disease (GERD), food and lifestyle choices are very important in easing your symptoms.  Eat small meals often instead of 3 large meals a day. Eat your meals slowly, and in a place where you are relaxed.  Limit high-fat foods such as fatty meat or fried foods.  Avoid bending over or lying down until 2-3 hours after eating.  Avoid peppermint and spearmint, caffeine, alcohol, and chocolate. This information is not intended to replace advice given to you by your health care provider. Make sure you discuss any questions you have with your health care provider. Document Released: 10/22/2011 Document Revised: 08/13/2018 Document Reviewed: 05/28/2016 Elsevier Patient Education  2020 [employer].

## 2019-02-12 NOTE — Assessment & Plan Note (Addendum)
Chronic since January 2020, resumed Nexium 20 mg about 8 days ago with some improvement. Has also had improvement with Pepcid.   Will complete 14 day course of Nexium, then transition to Pepcid thereafter. She will update.

## 2019-02-19 DIAGNOSIS — Z975 Presence of (intrauterine) contraceptive device: Secondary | ICD-10-CM

## 2019-02-22 ENCOUNTER — Telehealth: Payer: Self-pay | Admitting: Obstetrics & Gynecology

## 2019-02-22 NOTE — Telephone Encounter (Signed)
New referral placed. Thanks

## 2019-02-22 NOTE — Telephone Encounter (Signed)
Heather Park, can you please put in new referrral?

## 2019-02-22 NOTE — Telephone Encounter (Signed)
[facility] referring for IUD check up. Called and left voicemail for patient to call back to be schedule

## 2019-02-23 NOTE — Telephone Encounter (Signed)
Called and spoke with patient to schedule appointment. Patient is requesting to call back to schedule appointment.

## 2019-03-01 NOTE — Telephone Encounter (Signed)
Called and left voice mail for patient to call back to be schedule °

## 2019-03-02 ENCOUNTER — Ambulatory Visit (INDEPENDENT_AMBULATORY_CARE_PROVIDER_SITE_OTHER): Payer: No Typology Code available for payment source

## 2019-03-02 DIAGNOSIS — Z23 Encounter for immunization: Secondary | ICD-10-CM

## 2019-03-03 ENCOUNTER — Telehealth: Payer: Self-pay | Admitting: Primary Care

## 2019-03-03 NOTE — Telephone Encounter (Signed)
Opened in error

## 2019-11-03 ENCOUNTER — Other Ambulatory Visit: Payer: Self-pay | Admitting: Primary Care

## 2019-11-03 DIAGNOSIS — N6310 Unspecified lump in the right breast, unspecified quadrant: Secondary | ICD-10-CM

## 2019-11-03 DIAGNOSIS — Z1231 Encounter for screening mammogram for malignant neoplasm of breast: Secondary | ICD-10-CM

## 2019-11-03 DIAGNOSIS — N6320 Unspecified lump in the left breast, unspecified quadrant: Secondary | ICD-10-CM

## 2019-11-17 ENCOUNTER — Other Ambulatory Visit: Payer: Self-pay | Admitting: Primary Care

## 2019-11-17 DIAGNOSIS — R1013 Epigastric pain: Secondary | ICD-10-CM

## 2019-11-17 MED ORDER — FAMOTIDINE 20 MG PO TABS: 20.0000 mg | ORAL_TABLET | Freq: Every day | ORAL | 1 refills | Status: DC

## 2019-11-17 NOTE — Addendum Note (Signed)
Addended by: Jacqualin Combes on: 11/17/2019 11:41 AM   Modules accepted: Orders

## 2019-12-01 ENCOUNTER — Ambulatory Visit
Admission: RE | Admit: 2019-12-01 | Discharge: 2019-12-01 | Disposition: A | Discharge status: Home / Self Care | Payer: No Typology Code available for payment source | Source: Ambulatory Visit | Attending: Primary Care | Admitting: Primary Care

## 2019-12-01 DIAGNOSIS — N6320 Unspecified lump in the left breast, unspecified quadrant: Secondary | ICD-10-CM

## 2019-12-01 DIAGNOSIS — N6310 Unspecified lump in the right breast, unspecified quadrant: Secondary | ICD-10-CM | POA: Diagnosis present

## 2019-12-01 DIAGNOSIS — Z1231 Encounter for screening mammogram for malignant neoplasm of breast: Secondary | ICD-10-CM | POA: Insufficient documentation

## 2019-12-27 LAB — HM PAP SMEAR: HM Pap smear: NEGATIVE

## 2020-03-01 NOTE — Telephone Encounter (Signed)
Noted, will evaluate. 

## 2020-03-01 NOTE — Telephone Encounter (Signed)
[facility] - Client TELEPHONE ADVICE RECORD [facility] Patient Name: Heather Park Gender: Female DOB: July 16, 1980 Age: 39 Y 2 D Return Phone Number: [phone] (Primary) Address: City/State/Zip: [place] [address] Client [facility] - Client Client Site [place] - Day Physician Alma Friendly - NP Contact Type Call Who Is Calling Patient / Member / Family / Caregiver Call Type Triage / Clinical Relationship To Patient Self Return Phone Number [phone] (Primary) Chief Complaint Dizziness Reason for Call Symptomatic / Request for Health Information Initial Comment caller states she made an appt for acid reflux but she c/o dizziness also and the office needs her triaged Translation No Nurse Assessment Nurse: Radford Pax, RN, Eugene Garnet Date/Time (Eastern Time): 03/01/2020 9:00:47 AM Confirm and document reason for call. If symptomatic, describe symptoms. ---Caller states that she is having some dizziness. Hx of anemia and low BP. No dizziness at this time. Currently 109/77 HR 80. Does the patient have any new or worsening symptoms? ---Yes Will a triage be completed? ---Yes Related visit to physician within the last 2 weeks? ---No Does the PT have any chronic conditions? (i.e. diabetes, asthma, this includes High risk factors for pregnancy, etc.) ---No Is the patient pregnant or possibly pregnant? (Ask all females between the ages of 60-55) ---No Is this a behavioral health or substance abuse call? ---No Guidelines Guideline Title Affirmed Question Affirmed Notes Nurse Date/Time (Eastern Time) Dizziness - Lightheadedness [1] MODERATE dizziness (e.g., interferes with normal activities) AND [2] has NOT been evaluated by physician for this (Exception: dizziness caused by heat exposure, sudden standing, or poor fluid intake) Turner, RN, Rebekah 03/01/2020 9:04:30 AM Disp. Time Eilene Ghazi  Time) Disposition Final User 03/01/2020 9:07:36 AM See PCP within 24 Hours Yes Turner, RN, Eugene Garnet PLEASE NOTE: All timestamps contained within this report are represented as [place] Standard Time. CONFIDENTIALTY NOTICE: This fax transmission is intended only for the addressee. It contains information that is legally privileged, confidential or otherwise protected from use or disclosure. If you are not the intended recipient, you are strictly prohibited from reviewing, disclosing, copying using or disseminating any of this information or taking any action in reliance on or regarding this information. If you have received this fax in error, please notify us immediately by telephone so that we can arrange for its return to [place]. Phone: [phone], Toll-Free: [phone], Fax: [phone] Page: 2 of 2 Call Id: [id number] [place] Disagree/Comply Comply Caller Understands Yes PreDisposition Call Doctor Care Advice Given Per Guideline SEE PCP WITHIN 24 HOURS: * IF OFFICE WILL BE OPEN: You need to be examined within the next 24 hours. Call your doctor (or NP/PA) when the office opens and make an appointment. CALL BACK IF: * Passes out (faints) * You become worse CARE ADVICE given per Dizziness (Adult) guideline. Comments User: Susie Cassette, RN Date/Time Eilene Ghazi Time): 03/01/2020 9:12:57 AM Spoke with Raquel Sarna in office and advised pt of the 24 hour outcome related to dizziness and how pt still expressed wanting to come in on Monday instead of being seen earlier. She is more concerned about reflux vs the dizziness and seems stable at this time. Caller states she has had dizziness for a while and isn't as concerned as she is about the reflux. Referrals REFERRED TO PCP OFFICE

## 2020-03-01 NOTE — Telephone Encounter (Signed)
Per appt notes pt already has appt scheduled with Gentry Fitz NP on 03/06/20 at 10:20.

## 2020-03-06 ENCOUNTER — Other Ambulatory Visit: Payer: Self-pay | Admitting: Primary Care

## 2020-03-06 ENCOUNTER — Other Ambulatory Visit: Payer: Self-pay

## 2020-03-06 ENCOUNTER — Encounter: Payer: Self-pay | Admitting: Primary Care

## 2020-03-06 ENCOUNTER — Ambulatory Visit (INDEPENDENT_AMBULATORY_CARE_PROVIDER_SITE_OTHER): Payer: No Typology Code available for payment source | Admitting: Primary Care

## 2020-03-06 VITALS — BP 116/62 | HR 74 | Temp 96.0°F | Ht 61.0 in | Wt 116.0 lb

## 2020-03-06 DIAGNOSIS — Z23 Encounter for immunization: Secondary | ICD-10-CM

## 2020-03-06 DIAGNOSIS — Z0000 Encounter for general adult medical examination without abnormal findings: Secondary | ICD-10-CM

## 2020-03-06 DIAGNOSIS — K219 Gastro-esophageal reflux disease without esophagitis: Secondary | ICD-10-CM

## 2020-03-06 LAB — COMPREHENSIVE METABOLIC PANEL
ALT: 12 U/L (ref 0–35)
AST: 15 U/L (ref 0–37)
Albumin: 4.1 g/dL (ref 3.5–5.2)
Alkaline Phosphatase: 38 U/L — ABNORMAL LOW (ref 39–117)
BUN: 8 mg/dL (ref 6–23)
CO2: 28 mEq/L (ref 19–32)
Calcium: 9 mg/dL (ref 8.4–10.5)
Chloride: 103 mEq/L (ref 96–112)
Creatinine, Ser: 0.72 mg/dL (ref 0.40–1.20)
GFR: 106.37 mL/min (ref >60.00)
Glucose, Bld: 101 mg/dL — ABNORMAL HIGH (ref 70–99)
Potassium: 3.9 mEq/L (ref 3.5–5.1)
Sodium: 139 mEq/L (ref 135–145)
Total Bilirubin: 0.4 mg/dL (ref 0.2–1.2)
Total Protein: 7.2 g/dL (ref 6.0–8.3)

## 2020-03-06 LAB — LIPID PANEL
Cholesterol: 192 mg/dL (ref 0–200)
HDL: 96.7 mg/dL (ref >39.00)
LDL Cholesterol: 78 mg/dL (ref 0–99)
NonHDL: 95.3
Total CHOL/HDL Ratio: 2
Triglycerides: 88 mg/dL (ref 0.0–149.0)
VLDL: 17.6 mg/dL (ref 0.0–40.0)

## 2020-03-06 LAB — TSH: TSH: 0.85 u[IU]/mL (ref 0.35–4.50)

## 2020-03-06 LAB — CBC
HCT: 37.7 % (ref 36.0–46.0)
Hemoglobin: 12.4 g/dL (ref 12.0–15.0)
MCHC: 32.8 g/dL (ref 30.0–36.0)
MCV: 80.8 fl (ref 78.0–100.0)
Platelets: 178 10*3/uL (ref 150.0–400.0)
RBC: 4.66 Mil/uL (ref 3.87–5.11)
RDW: 13 % (ref 11.5–15.5)
WBC: 7.2 10*3/uL (ref 4.0–10.5)

## 2020-03-06 LAB — URIC ACID: Uric Acid, Serum: 4.5 mg/dL (ref 2.4–7.0)

## 2020-03-06 LAB — VITAMIN D 25 HYDROXY (VIT D DEFICIENCY, FRACTURES): VITD: 37.28 ng/mL (ref 30.00–100.00)

## 2020-03-06 NOTE — Patient Instructions (Addendum)
Continue taking famotidine (Pepcid) 20 mg daily for esophageal reflux.   You will be contacted regarding your referral to GI.  Please let us know if you have not been contacted within two weeks.   It was a pleasure to see you today!   Food Choices for Gastroesophageal Reflux Disease, Adult When you have gastroesophageal reflux disease (GERD), the foods you eat and your eating habits are very important. Choosing the right foods can help ease your discomfort. Think about working with a nutrition specialist (dietitian) to help you make good choices. What are tips for following this plan?  Meals  Choose healthy foods that are low in fat, such as fruits, vegetables, whole grains, low-fat dairy products, and lean meat, fish, and poultry.  Eat small meals often instead of 3 large meals a day. Eat your meals slowly, and in a place where you are relaxed. Avoid bending over or lying down until 2-3 hours after eating.  Avoid eating meals 2-3 hours before bed.  Avoid drinking a lot of liquid with meals.  Cook foods using methods other than frying. Bake, grill, or broil food instead.  Avoid or limit: ? Chocolate. ? Peppermint or spearmint. ? Alcohol. ? Pepper. ? Black and decaffeinated coffee. ? Black and decaffeinated tea. ? Bubbly (carbonated) soft drinks. ? Caffeinated energy drinks and soft drinks.  Limit high-fat foods such as: ? Fatty meat or fried foods. ? Whole milk, cream, butter, or ice cream. ? Nuts and nut butters. ? Pastries, donuts, and sweets made with butter or shortening.  Avoid foods that cause symptoms. These foods may be different for everyone. Common foods that cause symptoms include: ? Tomatoes. ? Oranges, lemons, and limes. ? Peppers. ? Spicy food. ? Onions and garlic. ? Vinegar. Lifestyle  Maintain a healthy weight. Ask your doctor what weight is healthy for you. If you need to lose weight, work with your doctor to do so safely.  Exercise for at least 30  minutes for 5 or more days each week, or as told by your doctor.  Wear loose-fitting clothes.  Do not smoke. If you need help quitting, ask your doctor.  Sleep with the head of your bed higher than your feet. Use a wedge under the mattress or blocks under the bed frame to raise the head of the bed. Summary  When you have gastroesophageal reflux disease (GERD), food and lifestyle choices are very important in easing your symptoms.  Eat small meals often instead of 3 large meals a day. Eat your meals slowly, and in a place where you are relaxed.  Limit high-fat foods such as fatty meat or fried foods.  Avoid bending over or lying down until 2-3 hours after eating.  Avoid peppermint and spearmint, caffeine, alcohol, and chocolate. This information is not intended to replace advice given to you by your health care provider. Make sure you discuss any questions you have with your health care provider. Document Revised: 08/13/2018 Document Reviewed: 05/28/2016 Elsevier Patient Education  [place].      Influenza (Flu) Vaccine (Inactivated or Recombinant): What You Need to Know 1. Why get vaccinated? Influenza vaccine can prevent influenza (flu). Flu is a contagious disease that spreads around the [place] every year, usually between October and May. Anyone can get the flu, but it is more dangerous for some people. Infants and young children, people 77 years of age and older, pregnant women, and people with certain health conditions or a weakened immune system are at  greatest risk of flu complications. Pneumonia, bronchitis, sinus infections and ear infections are examples of flu-related complications. If you have a medical condition, such as heart disease, cancer or diabetes, flu can make it worse. Flu can cause fever and chills, sore throat, muscle aches, fatigue, cough, headache, and runny or stuffy nose. Some people may have vomiting and diarrhea, though this is more common  in children than adults. Each year thousands of people in the [place] die from flu, and many more are hospitalized. Flu vaccine prevents millions of illnesses and flu-related visits to the doctor each year. 2. Influenza vaccine CDC recommends everyone 38 months of age and older get vaccinated every flu season. Children 6 months through 42 years of age may need 2 doses during a single flu season. Everyone else needs only 1 dose each flu season. It takes about 2 weeks for protection to develop after vaccination. There are many flu viruses, and they are always changing. Each year a new flu vaccine is made to protect against three or four viruses that are likely to cause disease in the upcoming flu season. Even when the vaccine doesn't exactly match these viruses, it may still provide some protection. Influenza vaccine does not cause flu. Influenza vaccine may be given at the same time as other vaccines. 3. Talk with your health care provider Tell your vaccine provider if the person getting the vaccine:  Has had an allergic reaction after a previous dose of influenza vaccine, or has any severe, life-threatening allergies.  Has ever had Guillain-Barr Syndrome (also called GBS). In some cases, your health care provider may decide to postpone influenza vaccination to a future visit. People with minor illnesses, such as a cold, may be vaccinated. People who are moderately or severely ill should usually wait until they recover before getting influenza vaccine. Your health care provider can give you more information. 4. Risks of a vaccine reaction  Soreness, redness, and swelling where shot is given, fever, muscle aches, and headache can happen after influenza vaccine.  There may be a very small increased risk of Guillain-Barr Syndrome (GBS) after inactivated influenza vaccine (the flu shot). Young children who get the flu shot along with pneumococcal vaccine (PCV13), and/or DTaP vaccine at the  same time might be slightly more likely to have a seizure caused by fever. Tell your health care provider if a child who is getting flu vaccine has ever had a seizure. People sometimes faint after medical procedures, including vaccination. Tell your provider if you feel dizzy or have vision changes or ringing in the ears. As with any medicine, there is a very remote chance of a vaccine causing a severe allergic reaction, other serious injury, or death. 5. What if there is a serious problem? An allergic reaction could occur after the vaccinated person leaves the clinic. If you see signs of a severe allergic reaction (hives, swelling of the face and throat, difficulty breathing, a fast heartbeat, dizziness, or weakness), call 9-1-1 and get the person to the nearest hospital. For other signs that concern you, call your health care provider. Adverse reactions should be reported to the Vaccine Adverse Event Reporting System (VAERS). Your health care provider will usually file this report, or you can do it yourself. Visit the VAERS website at www.vaers.SamedayNews.es or call 4072869893.VAERS is only for reporting reactions, and VAERS staff do not give medical advice. 6. The National Vaccine Injury Compensation Program The [employer] Vaccine Injury Compensation Program (VICP) is a Technical brewer that was  created to compensate people who may have been injured by certain vaccines. Visit the VICP website at GoldCloset.com.ee or call (850) 134-8545 to learn about the program and about filing a claim. There is a time limit to file a claim for compensation. 7. How can I learn more?  Ask your healthcare provider.  Call your local or state health department.  Contact the Centers for Disease Control and Prevention (CDC): ? Call (531)230-4253 (1-800-CDC-INFO) or ? Visit CDC's https://gibson.com/ Vaccine Information Statement (Interim) Inactivated Influenza Vaccine (12/18/2017) This information is not  intended to replace advice given to you by your health care provider. Make sure you discuss any questions you have with your health care provider. Document Revised: 08/11/2018 Document Reviewed: 12/22/2017 Elsevier Patient Education  [place].

## 2020-03-06 NOTE — Assessment & Plan Note (Signed)
Chronic for several years, not taking daily medication.  Discussed to continue famotidine 20 mg, okay to increase to BID if needed.  Referral placed to GI.

## 2020-03-06 NOTE — Addendum Note (Signed)
Addended by: Pilar Grammes on: 03/06/2020 10:50 AM   Modules accepted: Orders

## 2020-03-06 NOTE — Progress Notes (Signed)
Subjective:    Patient ID: Heather Park, female    DOB: February 10, 1981, 39 y.o.   MRN: [id number]  HPI  This visit occurred during the SARS-CoV-2 public health emergency.  Safety protocols were in place, including screening questions prior to the visit, additional usage of staff PPE, and extensive cleaning of exam room while observing appropriate contact time as indicated for disinfecting solutions.   Heather Park is a 39 year old female with a history of fatigue and dyspepsia who presents today with a chief complaint of esophageal reflux.  Chronic symptoms of esophageal reflux since January 2018 which are daily. She continues to experience daily symptoms which include mid thoracic back pressure, esophageal fullness, belching.   She has been taking famotidine 20 mg daily for the last 7-8 days, and has also finished a 14 day course of Nexium with improvement. She will typically take Maalox or Pepcid as needed for chronic symptoms.   She denies abdominal pain, nausea, decrease in appetite, difficulty swallowing. She is aware of triggers for esophageal reflux including coffee, stress, spicy food.   She would like to see GI for further insight into her daily symptoms as she tries to avoid triggers.   BP Readings from Last 3 Encounters:  03/06/20 116/62  02/12/19 116/70  11/24/18 114/62     Review of Systems  Constitutional: Negative for fever.  Gastrointestinal: Negative for abdominal pain, constipation, diarrhea and nausea.       Esophageal fullness, belching, throat fullness       Past Medical History:  Diagnosis Date   Breast mass 07/25/2016   palp lump left breast 4 o'clock left breast x 3 years-increases in size with periods   Dog bite of right hand 11/24/2018   Thoracic back pain 05/12/2018     Social History   Socioeconomic History   Marital status: Married    Spouse name: Not on file   Number of children: Not on file   Years of education: Not on file   Highest  education level: Not on file  Occupational History   Not on file  Tobacco Use   Smoking status: Never Smoker   Smokeless tobacco: Never Used  Vaping Use   Vaping Use: Never used  Substance and Sexual Activity   Alcohol use: Yes    Comment: socially   Drug use: No   Sexual activity: Not on file  Other Topics Concern   Not on file  Social History Narrative   Married.   2 children.   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    Social Determinants of Health   Financial Resource Strain:    Difficulty of Paying Living Expenses: Not on file  Food Insecurity:    Worried About Charity fundraiser in the Last Year: Not on file   [employer] of Food in the Last Year: Not on file  Transportation Needs:    Lack of Transportation (Medical): Not on file   Lack of Transportation (Non-Medical): Not on file  Physical Activity:    Days of Exercise per Week: Not on file   Minutes of Exercise per Session: Not on file  Stress:    Feeling of Stress : Not on file  Social Connections:    Frequency of Communication with Friends and Family: Not on file   Frequency of Social Gatherings with Friends and Family: Not on file   Attends Religious Services: Not on file   Active Member  of Clubs or Organizations: Not on file   Attends Archivist Meetings: Not on file   Marital Status: Not on file  Intimate Partner Violence:    Fear of Current or Ex-Partner: Not on file   Emotionally Abused: Not on file   Physically Abused: Not on file   Sexually Abused: Not on file    Past Surgical History:  Procedure Laterality Date   CESAREAN SECTION     x2    Family History  Problem Relation Age of Onset   Diabetes Father    Heart attack Maternal Grandmother    Breast cancer Paternal Aunt 22   Leukemia Maternal Uncle    Healthy Mother    Prostate cancer Paternal [place] Deck' disease Sister     Allergies  Allergen Reactions     Avocado Itching   Oxycodone Itching    Current Outpatient Medications on File Prior to Visit  Medication Sig Dispense Refill   Ascorbic Acid (VITAMIN C) 100 MG tablet Take 100 mg by mouth daily.      b complex vitamins capsule Take 1 capsule by mouth daily.     CALCIUM-MAGNESIUM-ZINC PO Take by mouth daily.     Esomeprazole Magnesium (NEXIUM PO) Take 1 tablet by mouth daily as needed.     famotidine (PEPCID) 20 MG tablet Take 1 tablet (20 mg total) by mouth at bedtime. For heartburn 90 tablet 1   ferrous sulfate 325 (65 FE) MG EC tablet Take 325 mg by mouth 3 (three) times daily with meals.     FOLIC ACID PO Take by mouth.     Multiple Vitamin (MULTIVITAMIN) tablet Take 1 tablet by mouth daily.     XULANE 150-35 MCG/24HR transdermal patch 1 patch once a week.     No current facility-administered medications on file prior to visit.    BP 116/62    Pulse 74    Temp (!) 96 F (35.6 C) (Temporal)    Ht 5\' 1"  (1.549 m)    Wt 116 lb (52.6 kg)    LMP 03/05/2020    SpO2 100%    BMI 21.92 kg/m    Objective:   Physical Exam Cardiovascular:     Rate and Rhythm: Normal rate and regular rhythm.  Pulmonary:     Effort: Pulmonary effort is normal.     Breath sounds: Normal breath sounds.  Musculoskeletal:     Cervical back: Neck supple.  Skin:    General: Skin is warm and dry.            Assessment & Plan:

## 2020-03-08 ENCOUNTER — Encounter: Payer: Self-pay | Admitting: Primary Care

## 2020-03-15 ENCOUNTER — Ambulatory Visit (INDEPENDENT_AMBULATORY_CARE_PROVIDER_SITE_OTHER): Payer: No Typology Code available for payment source | Admitting: Family Medicine

## 2020-03-15 ENCOUNTER — Encounter: Payer: Self-pay | Admitting: Family Medicine

## 2020-03-15 ENCOUNTER — Other Ambulatory Visit: Payer: Self-pay

## 2020-03-15 VITALS — BP 110/70 | HR 76 | Temp 97.1°F | Ht 61.0 in | Wt 114.0 lb

## 2020-03-15 DIAGNOSIS — M7061 Trochanteric bursitis, right hip: Secondary | ICD-10-CM | POA: Diagnosis not present

## 2020-03-15 DIAGNOSIS — M722 Plantar fascial fibromatosis: Secondary | ICD-10-CM | POA: Diagnosis not present

## 2020-03-15 MED ORDER — PREDNISONE 20 MG PO TABS: ORAL_TABLET | ORAL | 0 refills | Status: DC

## 2020-03-15 NOTE — Patient Instructions (Signed)

## 2020-03-15 NOTE — Progress Notes (Signed)
Chamberlain Steinborn T. Garold Sheeler, MD, [place] [place] at [facility] [place], [address]  Phone: [phone]  FAX: [place] - 39 y.o. female  MRN [id number]  Date of Birth: May 15, 1980  Date: 03/15/2020  PCP: [name], NP  Referral: [name], NP  Chief Complaint  Patient presents with  . Foot Pain    Right    This visit occurred during the SARS-CoV-2 public health emergency.  Safety protocols were in place, including screening questions prior to the visit, additional usage of staff PPE, and extensive cleaning of exam room while observing appropriate contact time as indicated for disinfecting solutions.   Subjective:   Heather Park is a 39 y.o. very pleasant female patient with Body mass index is 21.54 kg/m. who presents with the following:  She is a very pleasant young lady at age 23 and she is a orthopedics nurse in the hospital.  She stands for very long times and works 12-hour shifts.  She often works 3 to 4 days a week, and many other times she will work additional 12-hour shifts.  She has been having intermittent foot pain and heel pain as well as metatarsalgia for about 2 years.  She has been managing this at home with changing her shoes several times a day, wearing clogs which seem to help, and wearing Hoka's while she is working in the hospital.  She has been doing some foam rolling, stretching, icing, and various types of compression.  She also will take some NSAIDs before work and this will help relieve some pain, but she has had waxing and waning symptoms now for 2 years.  She also has some right-sided lateral hip pain which is been present relatively short amount of time in comparison.  Last weekend she did go trick-or-treating with her children, and this flared up her foot and to a significant extent her lateral hip on the right.  Review of Systems  is noted in the HPI, as appropriate   Objective:   BP 110/70   Pulse 76   Temp (!) 97.1 F (36.2 C) (Temporal)   Ht 5\' 1"  (1.549 m)   Wt 114 lb (51.7 kg)   LMP 03/05/2020   SpO2 99%   BMI 21.54 kg/m    HIP EXAM: SIDE: Right ROM: Abduction, Flexion, Internal and External range of motion: Full Pain with terminal IROM and EROM: None GTB: Right-sided lateral pain SLR: NEG Knees: No effusion FABER: NT REVERSE FABER: NT, neg Piriformis: NT at direct palpation Str: flexion: 5/5 abduction: 5/5 adduction: 5/5 Strength testing non-tender  Right foot: Nontender throughout the tibia and fibula.  Nontender at the talus and at the medial mall or malleolus.  Nontender at the navicular, the cuneiforms, cuboid, first through fifth metatarsals as well as the phalanges.  She does have some pain at the metatarsal heads inferiorly.  She also does have some pain along the plantar fascia, but this is somewhat more distal compared to classical appearance.  She has no pain at the Achilles.  Strength testing is 5/5 in all directions and she is entirely neurovascularly intact.     Radiology: No results found.  Assessment and Plan:     ICD-10-CM   1. Plantar fasciitis, right  M72.2   2. Trochanteric bursitis of right hip  M70.61    Plantar fasciitis is challenging, and she has been having this off and on  for 2 years.  At this length of time, she may have some problems off and on for quite some time.  We reviewed various ways to manage plantar fasciitis, and hopefully she will have some improvement of her symptoms.  Doubtful the things that I went over with her would cause any sort of difficulty.  I did give her some arch binders and reviewed a number of rehabilitation protocols.  With your GTB, it is mild to moderately tender, and this is almost certainly secondary.  We talked about other treatment options, and she is going to keep working on some strengthening, but her core and hips are quite  strong already.  Pulse with some oral steroids to try to help both the plantar fasciitis as well as the trochanteric bursitis.  Meds ordered this encounter  Medications  . predniSONE (DELTASONE) 20 MG tablet    Sig: 2 tabs po daily for 5 days, then 1 tab po daily for 5 days    Dispense:  15 tablet    Refill:  0   There are no discontinued medications. No orders of the defined types were placed in this encounter.   Follow-up: No follow-ups on file.  Signed,  Maud Deed. Masayoshi Couzens, MD   Outpatient Encounter Medications as of 03/15/2020  Medication Sig  . Ascorbic Acid (VITAMIN C) 100 MG tablet Take 100 mg by mouth daily.   Marland Kitchen b complex vitamins capsule Take 1 capsule by mouth daily.  Marland Kitchen CALCIUM-MAGNESIUM-ZINC PO Take by mouth daily.  . Esomeprazole Magnesium (NEXIUM PO) Take 1 tablet by mouth daily as needed.  . famotidine (PEPCID) 20 MG tablet Take 1 tablet (20 mg total) by mouth at bedtime. For heartburn  . ferrous sulfate 325 (65 FE) MG EC tablet Take 325 mg by mouth 3 (three) times daily with meals.  Marland Kitchen FOLIC ACID PO Take by mouth.  . Multiple Vitamin (MULTIVITAMIN) tablet Take 1 tablet by mouth daily.  Marilu Favre 150-35 MCG/24HR transdermal patch 1 patch once a week.  . predniSONE (DELTASONE) 20 MG tablet 2 tabs po daily for 5 days, then 1 tab po daily for 5 days   No facility-administered encounter medications on file as of 03/15/2020.

## 2020-04-03 ENCOUNTER — Encounter: Payer: Self-pay | Admitting: Primary Care

## 2020-04-03 ENCOUNTER — Ambulatory Visit (INDEPENDENT_AMBULATORY_CARE_PROVIDER_SITE_OTHER): Payer: No Typology Code available for payment source | Admitting: Primary Care

## 2020-04-03 ENCOUNTER — Other Ambulatory Visit: Payer: Self-pay

## 2020-04-03 VITALS — BP 108/62 | HR 78 | Temp 97.8°F | Ht 61.0 in | Wt 117.0 lb

## 2020-04-03 DIAGNOSIS — R5383 Other fatigue: Secondary | ICD-10-CM

## 2020-04-03 DIAGNOSIS — K219 Gastro-esophageal reflux disease without esophagitis: Secondary | ICD-10-CM | POA: Diagnosis not present

## 2020-04-03 DIAGNOSIS — D242 Benign neoplasm of left breast: Secondary | ICD-10-CM

## 2020-04-03 DIAGNOSIS — Z0000 Encounter for general adult medical examination without abnormal findings: Secondary | ICD-10-CM

## 2020-04-03 DIAGNOSIS — M2550 Pain in unspecified joint: Secondary | ICD-10-CM

## 2020-04-03 DIAGNOSIS — N920 Excessive and frequent menstruation with regular cycle: Secondary | ICD-10-CM | POA: Insufficient documentation

## 2020-04-03 NOTE — Assessment & Plan Note (Signed)
Secondary to iron deficiency anemia from menorrhagia. Significant improvement with hormonal patches as menses are more controlled. Continue Xulane patches.

## 2020-04-03 NOTE — Assessment & Plan Note (Signed)
Improved with famotidine 20 mg BID. Continue same for now. She will update if symptoms progress.

## 2020-04-03 NOTE — Assessment & Plan Note (Signed)
Improved with Xulane patches, follows with GYN. Recent CBC unremarkable.

## 2020-04-03 NOTE — Assessment & Plan Note (Signed)
Immunizations UTD. Pap smear UTD. Mammogram due at age 39.  Encouraged a healthy diet and regular exercise.  Exam today unremarkable. Labs reviewed.

## 2020-04-03 NOTE — Patient Instructions (Signed)
Continue exercising. You should be getting 150 minutes of moderate intensity exercise weekly.  Continue to eat a healthy diet. Ensure you are consuming 64 ounces of water daily.  It was a pleasure to see you today!   Preventive Care 58-38 Years Old, Female Preventive care refers to visits with your health care provider and lifestyle choices that can promote health and wellness. This includes:  A yearly physical exam. This may also be called an annual well check.  Regular dental visits and eye exams.  Immunizations.  Screening for certain conditions.  Healthy lifestyle choices, such as eating a healthy diet, getting regular exercise, not using drugs or products that contain nicotine and tobacco, and limiting alcohol use. What can I expect for my preventive care visit? Physical exam Your health care provider will check your:  Height and weight. This may be used to calculate body mass index (BMI), which tells if you are at a healthy weight.  Heart rate and blood pressure.  Skin for abnormal spots. Counseling Your health care provider may ask you questions about your:  Alcohol, tobacco, and drug use.  Emotional well-being.  Home and relationship well-being.  Sexual activity.  Eating habits.  Work and work Statistician.  Method of birth control.  Menstrual cycle.  Pregnancy history. What immunizations do I need?  Influenza (flu) vaccine  This is recommended every year. Tetanus, diphtheria, and pertussis (Tdap) vaccine  You may need a Td booster every 10 years. Varicella (chickenpox) vaccine  You may need this if you have not been vaccinated. Human papillomavirus (HPV) vaccine  If recommended by your health care provider, you may need three doses over 6 months. Measles, mumps, and rubella (MMR) vaccine  You may need at least one dose of MMR. You may also need a second dose. Meningococcal conjugate (MenACWY) vaccine  One dose is recommended if you are age  38-21 years and a first-year college student living in a residence hall, or if you have one of several medical conditions. You may also need additional booster doses. Pneumococcal conjugate (PCV13) vaccine  You may need this if you have certain conditions and were not previously vaccinated. Pneumococcal polysaccharide (PPSV23) vaccine  You may need one or two doses if you smoke cigarettes or if you have certain conditions. Hepatitis A vaccine  You may need this if you have certain conditions or if you travel or work in places where you may be exposed to hepatitis A. Hepatitis B vaccine  You may need this if you have certain conditions or if you travel or work in places where you may be exposed to hepatitis B. Haemophilus influenzae type b (Hib) vaccine  You may need this if you have certain conditions. You may receive vaccines as individual doses or as more than one vaccine together in one shot (combination vaccines). Talk with your health care provider about the risks and benefits of combination vaccines. What tests do I need?  Blood tests  Lipid and cholesterol levels. These may be checked every 5 years starting at age 49.  Hepatitis C test.  Hepatitis B test. Screening  Diabetes screening. This is done by checking your blood sugar (glucose) after you have not eaten for a while (fasting).  Sexually transmitted disease (STD) testing.  BRCA-related cancer screening. This may be done if you have a family history of breast, ovarian, tubal, or peritoneal cancers.  Pelvic exam and Pap test. This may be done every 3 years starting at age 72. Starting at age  30, this may be done every 5 years if you have a Pap test in combination with an HPV test. Talk with your health care provider about your test results, treatment options, and if necessary, the need for more tests. Follow these instructions at home: Eating and drinking   Eat a diet that includes fresh fruits and vegetables, whole  grains, lean protein, and low-fat dairy.  Take vitamin and mineral supplements as recommended by your health care provider.  Do not drink alcohol if: ? Your health care provider tells you not to drink. ? You are pregnant, may be pregnant, or are planning to become pregnant.  If you drink alcohol: ? Limit how much you have to 0-1 drink a day. ? Be aware of how much alcohol is in your drink. In the [place] one drink equals one 12 oz bottle of beer (355 mL), one 5 oz glass of wine (148 mL), or one 1 oz glass of hard liquor (44 mL). Lifestyle  Take daily care of your teeth and gums.  Stay active. Exercise for at least 30 minutes on 5 or more days each week.  Do not use any products that contain nicotine or tobacco, such as cigarettes, e-cigarettes, and chewing tobacco. If you need help quitting, ask your health care provider.  If you are sexually active, practice safe sex. Use a condom or other form of birth control (contraception) in order to prevent pregnancy and STIs (sexually transmitted infections). If you plan to become pregnant, see your health care provider for a preconception visit. What's next?  Visit your health care provider once a year for a well check visit.  Ask your health care provider how often you should have your eyes and teeth checked.  Stay up to date on all vaccines. This information is not intended to replace advice given to you by your health care provider. Make sure you discuss any questions you have with your health care provider. Document Revised: 01/01/2018 Document Reviewed: 01/01/2018 Elsevier Patient Education  2020 [employer].

## 2020-04-03 NOTE — Assessment & Plan Note (Signed)
Benign, mammogram due again at age 39.

## 2020-04-03 NOTE — Assessment & Plan Note (Signed)
Chronic but stable. Overall manages well with stretching and activity. Continue to monitor.

## 2020-04-03 NOTE — Progress Notes (Signed)
Subjective:    Patient ID: Heather Park, female    DOB: 08-01-1980, 39 y.o.   MRN: [id number]  HPI  This visit occurred during the SARS-CoV-2 public health emergency.  Safety protocols were in place, including screening questions prior to the visit, additional usage of staff PPE, and extensive cleaning of exam room while observing appropriate contact time as indicated for disinfecting solutions.   Heather Park is a 39 year old female who presents today for complete physical.  Immunizations: -Tetanus: Completed in 2020 -Influenza: Completed this season  -Covid-19: Has not completed.   Diet: She endorses a healthy diet.  Exercise: She is walking  Eye exam: No recent exam Dental exam: Completes semi-annually   Pap Smear: Completed in 2021     Review of Systems  Constitutional: Negative for fatigue and unexpected weight change.  HENT: Negative for rhinorrhea.   Respiratory: Negative for cough and shortness of breath.   Cardiovascular: Negative for chest pain.  Gastrointestinal: Negative for constipation and diarrhea.  Genitourinary: Negative for difficulty urinating and menstrual problem.  Musculoskeletal: Positive for arthralgias.  Skin: Negative for rash.  Allergic/Immunologic: Negative for environmental allergies.  Neurological: Negative for dizziness and headaches.  Psychiatric/Behavioral: The patient is not nervous/anxious.        Past Medical History:  Diagnosis Date  . Breast mass 07/25/2016   palp lump left breast 4 o'clock left breast x 3 years-increases in size with periods  . Dog bite of right hand 11/24/2018  . Thoracic back pain 05/12/2018     Social History   Socioeconomic History  . Marital status: Married    Spouse name: Not on file  . Number of children: Not on file  . Years of education: Not on file  . Highest education level: Not on file  Occupational History  . Not on file  Tobacco Use  . Smoking status: Never Smoker  . Smokeless tobacco:  Never Used  Vaping Use  . Vaping Use: Never used  Substance and Sexual Activity  . Alcohol use: Yes    Comment: socially  . Drug use: No  . Sexual activity: Not on file  Other Topics Concern  . Not on file  Social History Narrative   Married.   2 children.   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    Social Determinants of Health   Financial Resource Strain:   . Difficulty of Paying Living Expenses: Not on file  Food Insecurity:   . Worried About Charity fundraiser in the Last Year: Not on file  . Ran Out of Food in the Last Year: Not on file  Transportation Needs:   . Lack of Transportation (Medical): Not on file  . Lack of Transportation (Non-Medical): Not on file  Physical Activity:   . Days of Exercise per Week: Not on file  . Minutes of Exercise per Session: Not on file  Stress:   . Feeling of Stress : Not on file  Social Connections:   . Frequency of Communication with Friends and Family: Not on file  . Frequency of Social Gatherings with Friends and Family: Not on file  . Attends Religious Services: Not on file  . Active Member of Clubs or Organizations: Not on file  . Attends Archivist Meetings: Not on file  . Marital Status: Not on file  Intimate Partner Violence:   . Fear of Current or Ex-Partner: Not on file  . Emotionally Abused:  Not on file  . Physically Abused: Not on file  . Sexually Abused: Not on file    Past Surgical History:  Procedure Laterality Date  . CESAREAN SECTION     x2    Family History  Problem Relation Age of Onset  . Diabetes Father   . Heart attack Maternal Grandmother   . Breast cancer Paternal Aunt 58  . Leukemia Maternal Uncle   . Healthy Mother   . Prostate cancer Paternal Uncle   . Graves' disease Sister     Allergies  Allergen Reactions  . Avocado Itching  . Oxycodone Itching    Current Outpatient Medications on File Prior to Visit  Medication Sig Dispense Refill  .  Ascorbic Acid (VITAMIN C) 100 MG tablet Take 100 mg by mouth daily.     Marland Kitchen b complex vitamins capsule Take 1 capsule by mouth daily.    Marland Kitchen CALCIUM-MAGNESIUM-ZINC PO Take by mouth daily.    . Esomeprazole Magnesium (NEXIUM PO) Take 1 tablet by mouth daily as needed.    . famotidine (PEPCID) 20 MG tablet Take 1 tablet (20 mg total) by mouth at bedtime. For heartburn 90 tablet 1  . ferrous sulfate 325 (65 FE) MG EC tablet Take 325 mg by mouth 3 (three) times daily with meals.    Marland Kitchen FOLIC ACID PO Take by mouth.    . Multiple Vitamin (MULTIVITAMIN) tablet Take 1 tablet by mouth daily.    Marilu Favre 150-35 MCG/24HR transdermal patch 1 patch once a week.     No current facility-administered medications on file prior to visit.    BP 108/62   Pulse 78   Temp 97.8 F (36.6 C) (Temporal)   Ht 5\' 1"  (1.549 m)   Wt 117 lb (53.1 kg)   LMP 03/05/2020   SpO2 99%   BMI 22.11 kg/m    Objective:   Physical Exam HENT:     Right Ear: Tympanic membrane and ear canal normal.     Left Ear: Tympanic membrane and ear canal normal.  Eyes:     Pupils: Pupils are equal, round, and reactive to light.  Cardiovascular:     Rate and Rhythm: Normal rate and regular rhythm.  Pulmonary:     Effort: Pulmonary effort is normal.     Breath sounds: Normal breath sounds.  Abdominal:     General: Bowel sounds are normal.     Palpations: Abdomen is soft.     Tenderness: There is no abdominal tenderness.  Musculoskeletal:        General: Normal range of motion.     Cervical back: Neck supple.  Skin:    General: Skin is warm and dry.  Neurological:     Mental Status: She is alert and oriented to person, place, and time.     Cranial Nerves: No cranial nerve deficit.     Deep Tendon Reflexes:     Reflex Scores:      Patellar reflexes are 2+ on the right side and 2+ on the left side. Psychiatric:        Mood and Affect: Mood normal.            Assessment & Plan:

## 2020-04-17 ENCOUNTER — Ambulatory Visit: Payer: No Typology Code available for payment source | Admitting: Gastroenterology

## 2020-05-09 ENCOUNTER — Ambulatory Visit: Payer: No Typology Code available for payment source | Admitting: Gastroenterology

## 2020-06-19 ENCOUNTER — Encounter: Payer: Self-pay | Admitting: Gastroenterology

## 2020-06-19 ENCOUNTER — Other Ambulatory Visit: Payer: Self-pay

## 2020-06-19 ENCOUNTER — Ambulatory Visit (INDEPENDENT_AMBULATORY_CARE_PROVIDER_SITE_OTHER): Payer: No Typology Code available for payment source | Admitting: Gastroenterology

## 2020-06-19 VITALS — BP 126/75 | HR 78 | Temp 97.3°F | Ht 61.0 in | Wt 116.4 lb

## 2020-06-19 DIAGNOSIS — K219 Gastro-esophageal reflux disease without esophagitis: Secondary | ICD-10-CM | POA: Diagnosis not present

## 2020-06-19 MED ORDER — OMEPRAZOLE 40 MG PO CPDR: 40.0000 mg | DELAYED_RELEASE_CAPSULE | Freq: Every day | ORAL | 0 refills | Status: DC

## 2020-06-19 NOTE — Patient Instructions (Signed)
Food Choices for Gastroesophageal Reflux Disease, Adult When you have gastroesophageal reflux disease (GERD), the foods you eat and your eating habits are very important. Choosing the right foods can help ease your discomfort. Think about working with a food expert (dietitian) to help you make good choices. What are tips for following this plan? Reading food labels  Look for foods that are low in saturated fat. Foods that may help with your symptoms include: ? Foods that have less than 5% of daily value (DV) of fat. ? Foods that have 0 grams of trans fat. Cooking  Do not fry your food.  Cook your food by baking, steaming, grilling, or broiling. These are all methods that do not need a lot of fat for cooking.  To add flavor, try to use herbs that are low in spice and acidity. Meal planning  Choose healthy foods that are low in fat, such as: ? Fruits and vegetables. ? Whole grains. ? Low-fat dairy products. ? Lean meats, fish, and poultry.  Eat small meals often instead of eating 3 large meals each day. Eat your meals slowly in a place where you are relaxed. Avoid bending over or lying down until 2-3 hours after eating.  Limit high-fat foods such as fatty meats or fried foods.  Limit your intake of fatty foods, such as oils, butter, and shortening.  Avoid the following as told by your doctor: ? Foods that cause symptoms. These may be different for different people. Keep a food diary to keep track of foods that cause symptoms. ? Alcohol. ? Drinking a lot of liquid with meals. ? Eating meals during the 2-3 hours before bed.   Lifestyle  Stay at a healthy weight. Ask your doctor what weight is healthy for you. If you need to lose weight, work with your doctor to do so safely.  Exercise for at least 30 minutes on 5 or more days each week, or as told by your doctor.  Wear loose-fitting clothes.  Do not smoke or use any products that contain nicotine or tobacco. If you need help  quitting, ask your doctor.  Sleep with the head of your bed higher than your feet. Use a wedge under the mattress or blocks under the bed frame to raise the head of the bed.  Chew sugar-free gum after meals. What foods should eat? Eat a healthy, well-balanced diet of fruits, vegetables, whole grains, low-fat dairy products, lean meats, fish, and poultry. Each person is different. Foods that may cause symptoms in one person may not cause any symptoms in another person. Work with your doctor to find foods that are safe for you. The items listed above may not be a complete list of what you can eat and drink. Contact a food expert for more options.   What foods should I avoid? Limiting some of these foods may help in managing the symptoms of GERD. Everyone is different. Talk with a food expert or your doctor to help you find the exact foods to avoid, if any. Fruits Any fruits prepared with added fat. Any fruits that cause symptoms. For some people, this may include citrus fruits, such as oranges, grapefruit, pineapple, and lemons. Vegetables Deep-fried vegetables. French fries. Any vegetables prepared with added fat. Any vegetables that cause symptoms. For some people, this may include tomatoes and tomato products, chili peppers, onions and garlic, and horseradish. Grains Pastries or quick breads with added fat. Meats and other proteins High-fat meats, such as fatty beef or pork,   hot dogs, ribs, ham, sausage, salami, and bacon. Fried meat or protein, including fried fish and fried chicken. Nuts and nut butters, in large amounts. Dairy Whole milk and chocolate milk. Sour cream. Cream. Ice cream. Cream cheese. Milkshakes. Fats and oils Butter. Margarine. Shortening. Ghee. Beverages Coffee and tea, with or without caffeine. Carbonated beverages. Sodas. Energy drinks. Fruit juice made with acidic fruits, such as orange or grapefruit. Tomato juice. Alcoholic drinks. Sweets and desserts Chocolate and  cocoa. Donuts. Seasonings and condiments Pepper. Peppermint and spearmint. Added salt. Any condiments, herbs, or seasonings that cause symptoms. For some people, this may include curry, hot sauce, or vinegar-based salad dressings. The items listed above may not be a complete list of what you should not eat and drink. Contact a food expert for more options. Questions to ask your doctor Diet and lifestyle changes are often the first steps that are taken to manage symptoms of GERD. If diet and lifestyle changes do not help, talk with your doctor about taking medicines. Where to find more information  International Foundation for Gastrointestinal Disorders: aboutgerd.org Summary  When you have GERD, food and lifestyle choices are very important in easing your symptoms.  Eat small meals often instead of 3 large meals a day. Eat your meals slowly and in a place where you are relaxed.  Avoid bending over or lying down until 2-3 hours after eating.  Limit high-fat foods such as fatty meats or fried foods. This information is not intended to replace advice given to you by your health care provider. Make sure you discuss any questions you have with your health care provider. Document Revised: 11/01/2019 Document Reviewed: 11/01/2019 Elsevier Patient Education  2021 Elsevier Inc.  

## 2020-06-19 NOTE — Progress Notes (Signed)
Heather Darby, MD [address]  [place]  [place], [place] [address]  Main: [phone]  Fax: [phone]    Gastroenterology Consultation  Referring Provider:     [name], NP Primary Care Physician:  [name], NP Primary Gastroenterologist:  Dr. Cephas Park Reason for Consultation:     gerd        HPI:   Heather Park is a 40 y.o. female referred by Dr. Carlis Abbott, Leticia Penna, NP  for consultation & management of reflux.  Patient reports that since New Year's, she has been experiencing severe heartburn, sore throat.  She reports that she had few glasses of wine for New Year's Eve.  She had severe nausea followed by vomiting.  She generally has a glass of wine with dinner most of the days.  Since episode of emesis, she has been having severe burning in her mid upper back.  She started on Pepcid 20 mg twice daily which she has been taking it as needed only.  She does acknowledge drinking about 500 to 600 mL of coffee daily.  She has cut back on wine.  However, consumes about once or twice a week.  She does report that drinking wine triggers burning in her chest.  She works as a Marine scientist at [employer].  She does not find her work to be stressful.  Her labs are normal.  She denies any epigastric pain.  She is from [place].  She does not smoke  NSAIDs: None  Antiplts/Anticoagulants/Anti thrombotics: None  GI Procedures: None She denies family history of GI malignancy  Past Medical History:  Diagnosis Date  . Breast mass 07/25/2016   palp lump left breast 4 o'clock left breast x 3 years-increases in size with periods  . Dog bite of right hand 11/24/2018  . Thoracic back pain 05/12/2018    Past Surgical History:  Procedure Laterality Date  . CESAREAN SECTION     x2    Current Outpatient Medications:  .  Ascorbic Acid (VITAMIN C) 100 MG tablet, Take 100 mg by mouth daily. , Disp: , Rfl:  .  b complex vitamins capsule, Take 1 capsule by mouth daily.,  Disp: , Rfl:  .  CALCIUM-MAGNESIUM-ZINC PO, Take by mouth daily., Disp: , Rfl:  .  etonogestrel-ethinyl estradiol (NUVARING) 0.12-0.015 MG/24HR vaginal ring, USE VAGINALLY FOR THREE WEEKS THEN ONE WEEK OFF, Disp: , Rfl:  .  famotidine (PEPCID) 20 MG tablet, Take 1 tablet (20 mg total) by mouth at bedtime. For heartburn, Disp: 90 tablet, Rfl: 1 .  ferrous sulfate 325 (65 FE) MG EC tablet, Take 325 mg by mouth 3 (three) times daily with meals., Disp: , Rfl:  .  FOLIC ACID PO, Take by mouth., Disp: , Rfl:  .  Multiple Vitamin (MULTIVITAMIN) tablet, Take 1 tablet by mouth daily., Disp: , Rfl:  .  omeprazole (PRILOSEC) 40 MG capsule, Take 1 capsule (40 mg total) by mouth daily before breakfast., Disp: 30 capsule, Rfl: 0   Family History  Problem Relation Age of Onset  . Diabetes Father   . Heart attack Maternal Grandmother   . Breast cancer Paternal Aunt 75  . Leukemia Maternal Uncle   . Healthy Mother   . Prostate cancer Paternal Uncle   . Graves' disease Sister      Social History   Tobacco Use  . Smoking status: Never Smoker  . Smokeless tobacco: Never Used  Vaping Use  . Vaping Use: Never used  Substance Use Topics  . Alcohol use: Yes    Comment: socially  . Drug use: No    Allergies as of 06/19/2020 - Review Complete 06/19/2020  Allergen Reaction Noted  . Avocado Itching 05/08/2018  . Oxycodone Itching 05/08/2018    Review of Systems:    All systems reviewed and negative except where noted in HPI.   Physical Exam:  BP 126/75 (BP Location: Left Arm, Patient Position: Sitting, Cuff Size: Normal)   Pulse 78   Temp (!) 97.3 F (36.3 C) (Oral)   Ht 5\' 1"  (1.549 m)   Wt 116 lb 6 oz (52.8 kg)   BMI 21.99 kg/m  No LMP recorded.  General:   Alert,  Well-developed, well-nourished, pleasant and cooperative in NAD Head:  Normocephalic and atraumatic. Eyes:  Sclera clear, no icterus.   Conjunctiva pink. Ears:  Normal auditory acuity. Nose:  No deformity, discharge, or  lesions. Mouth:  No deformity or lesions,oropharynx pink & moist. Neck:  Supple; no masses or thyromegaly. Lungs:  Respirations even and unlabored.  Clear throughout to auscultation.   No wheezes, crackles, or rhonchi. No acute distress. Heart:  Regular rate and rhythm; no murmurs, clicks, rubs, or gallops. Abdomen:  Normal bowel sounds. Soft, non-tender and non-distended without masses, hepatosplenomegaly or hernias noted.  No guarding or rebound tenderness.   Rectal: Not performed Msk:  Symmetrical without gross deformities. Good, equal movement & strength bilaterally. Pulses:  Normal pulses noted. Extremities:  No clubbing or edema.  No cyanosis. Neurologic:  Alert and oriented x3;  grossly normal neurologically. Skin:  Intact without significant lesions or rashes. No jaundice. Lymph Nodes:  No significant cervical adenopathy. Psych:  Alert and cooperative. Normal mood and affect.  Imaging Studies: None  Assessment and Plan:   [name] is a 40 y.o. pleasant Filipino female with chronic GERD Start omeprazole 40 mg daily before breakfast for 1 month Discussed about antireflux lifestyle, information provided Strongly advised to avoid alcohol and cut back on coffee If her symptoms are persistent, can perform upper endoscopy  Given her ethnicity, recommend to check H. pylori IgG (Patient is on H2 blocker intermittently)  She will update me via MyChart Follow up as needed   Heather Darby, MD

## 2020-06-21 ENCOUNTER — Telehealth: Payer: Self-pay

## 2020-06-21 ENCOUNTER — Encounter: Payer: Self-pay | Admitting: Gastroenterology

## 2020-06-21 NOTE — Telephone Encounter (Signed)
Per Dr. Marius Ditch scheduled a EGD for epigastric pain. Will do antibiotics after EGD.  Called and left a message for call back

## 2020-06-21 NOTE — Telephone Encounter (Signed)
Patient states she has to check her scheduled and will give [place] a call back tomorrow when she can do it

## 2020-06-22 ENCOUNTER — Other Ambulatory Visit: Payer: Self-pay

## 2020-06-22 DIAGNOSIS — R1013 Epigastric pain: Secondary | ICD-10-CM

## 2020-06-22 NOTE — Telephone Encounter (Signed)
Called patient back and she said she will do procedure on 07/11/2020 and COVID test on 07/07/2020

## 2020-06-22 NOTE — Telephone Encounter (Signed)
Patient called LVM to call her to schedule her EGD

## 2020-06-23 NOTE — Telephone Encounter (Signed)
Called patient and moved patient to 07/24/2020. Updated the referral called endo and talk to Jocelyn Lamer and they will get patient moved

## 2020-06-26 LAB — H. PYLORI ANTIBODY, IGG: H. pylori, IgG AbS: 0.31 Index Value (ref 0.00–0.79)

## 2020-06-27 ENCOUNTER — Encounter: Payer: Self-pay | Admitting: Gastroenterology

## 2020-06-29 ENCOUNTER — Telehealth: Payer: Self-pay | Admitting: Primary Care

## 2020-06-29 NOTE — Telephone Encounter (Signed)
Patient informed at reception for pick up.

## 2020-06-29 NOTE — Telephone Encounter (Signed)
Patient is requesting a copy of her immunizations she specifically needs it for her flu shot from last year. Can we please pull it and leave at front desk for the patient to pick up? Please advise patient when its ready.EM

## 2020-06-30 ENCOUNTER — Telehealth: Payer: Self-pay | Admitting: Primary Care

## 2020-06-30 DIAGNOSIS — R7611 Nonspecific reaction to tuberculin skin test without active tuberculosis: Secondary | ICD-10-CM

## 2020-06-30 NOTE — Telephone Encounter (Signed)
Pt called in wanted to know if she can get a letter stating that she is in good health for her new employer

## 2020-07-03 NOTE — Telephone Encounter (Signed)
Called patient states she needs letter that she has had recent physical and is in good health.  Last CPE on 04/03/2020

## 2020-07-04 ENCOUNTER — Ambulatory Visit (INDEPENDENT_AMBULATORY_CARE_PROVIDER_SITE_OTHER)
Admission: RE | Admit: 2020-07-04 | Discharge: 2020-07-04 | Disposition: A | Discharge status: Home / Self Care | Payer: No Typology Code available for payment source | Source: Ambulatory Visit | Attending: Primary Care | Admitting: Primary Care

## 2020-07-04 DIAGNOSIS — R7611 Nonspecific reaction to tuberculin skin test without active tuberculosis: Secondary | ICD-10-CM | POA: Diagnosis not present

## 2020-07-04 NOTE — Telephone Encounter (Signed)
PT CALLED IN WANTED TO KNOW IF SHE CAN GET AN ORDER FOR CHEST X-RAY FOR EMPLOYER REASONS AND THEY STATED THAT IT HAD TO COME FROM THE PCP

## 2020-07-04 NOTE — Telephone Encounter (Signed)
Pt needs for TB screening she has had positive PPD and [employer] her new work is requesting a x ray that has been done in last 12 months.

## 2020-07-04 NOTE — Telephone Encounter (Signed)
Noted, chest x-ray ordered.

## 2020-07-04 NOTE — Telephone Encounter (Signed)
Can you call and set up for xray? Does she need app for that?

## 2020-07-04 NOTE — Telephone Encounter (Signed)
Called patient. Does not need appointment. She will come in today.

## 2020-07-04 NOTE — Telephone Encounter (Signed)
Called patient l/m to let know letter is ready and can be picked up at reception.

## 2020-07-04 NOTE — Telephone Encounter (Signed)
Letter printed and placed in Joellen's inbox.

## 2020-07-20 ENCOUNTER — Other Ambulatory Visit: Admission: RE | Admit: 2020-07-20 | Payer: No Typology Code available for payment source | Source: Ambulatory Visit

## 2020-07-20 ENCOUNTER — Other Ambulatory Visit: Payer: Self-pay | Admitting: Gastroenterology

## 2020-07-20 DIAGNOSIS — K219 Gastro-esophageal reflux disease without esophagitis: Secondary | ICD-10-CM

## 2020-07-21 ENCOUNTER — Other Ambulatory Visit
Admission: RE | Admit: 2020-07-21 | Discharge: 2020-07-21 | Disposition: A | Discharge status: Home / Self Care | Payer: No Typology Code available for payment source | Source: Ambulatory Visit | Attending: Gastroenterology | Admitting: Gastroenterology

## 2020-07-21 ENCOUNTER — Other Ambulatory Visit: Payer: Self-pay

## 2020-07-21 ENCOUNTER — Encounter: Payer: Self-pay | Admitting: Gastroenterology

## 2020-07-21 DIAGNOSIS — Z01812 Encounter for preprocedural laboratory examination: Secondary | ICD-10-CM | POA: Insufficient documentation

## 2020-07-21 DIAGNOSIS — Z20822 Contact with and (suspected) exposure to covid-19: Secondary | ICD-10-CM | POA: Diagnosis not present

## 2020-07-21 LAB — SARS CORONAVIRUS 2 (TAT 6-24 HRS): SARS Coronavirus 2: NEGATIVE

## 2020-07-24 ENCOUNTER — Other Ambulatory Visit: Payer: Self-pay

## 2020-07-24 ENCOUNTER — Encounter
Admission: RE | Disposition: A | Discharge status: Home / Self Care | Payer: Self-pay | Source: Home / Self Care | Attending: Gastroenterology

## 2020-07-24 ENCOUNTER — Encounter: Payer: Self-pay | Admitting: Gastroenterology

## 2020-07-24 ENCOUNTER — Encounter: Admission: RE | Payer: Self-pay | Source: Home / Self Care

## 2020-07-24 ENCOUNTER — Ambulatory Visit: Payer: No Typology Code available for payment source | Admitting: Certified Registered"

## 2020-07-24 ENCOUNTER — Ambulatory Visit
Admission: RE | Admit: 2020-07-24 | Payer: No Typology Code available for payment source | Source: Home / Self Care | Admitting: Gastroenterology

## 2020-07-24 ENCOUNTER — Ambulatory Visit
Admission: RE | Admit: 2020-07-24 | Discharge: 2020-07-24 | Disposition: A | Discharge status: Home / Self Care | Payer: No Typology Code available for payment source | Attending: Gastroenterology | Admitting: Gastroenterology

## 2020-07-24 DIAGNOSIS — Z79899 Other long term (current) drug therapy: Secondary | ICD-10-CM | POA: Diagnosis not present

## 2020-07-24 DIAGNOSIS — K219 Gastro-esophageal reflux disease without esophagitis: Secondary | ICD-10-CM | POA: Diagnosis present

## 2020-07-24 DIAGNOSIS — Z793 Long term (current) use of hormonal contraceptives: Secondary | ICD-10-CM | POA: Diagnosis not present

## 2020-07-24 HISTORY — PX: ESOPHAGOGASTRODUODENOSCOPY: SHX5428

## 2020-07-24 HISTORY — DX: Gastro-esophageal reflux disease without esophagitis: K21.9

## 2020-07-24 LAB — POCT PREGNANCY, URINE: Preg Test, Ur: NEGATIVE

## 2020-07-24 SURGERY — ESOPHAGOGASTRODUODENOSCOPY (EGD) WITH PROPOFOL
Anesthesia: General

## 2020-07-24 SURGERY — EGD (ESOPHAGOGASTRODUODENOSCOPY)
Anesthesia: General

## 2020-07-24 MED ORDER — GLYCOPYRROLATE 0.2 MG/ML IJ SOLN
INTRAMUSCULAR | Status: DC | PRN
  Administered 2020-07-24: .2 mg via INTRAVENOUS

## 2020-07-24 MED ORDER — PROPOFOL 10 MG/ML IV BOLUS
INTRAVENOUS | Status: DC | PRN
  Administered 2020-07-24: 80 mg via INTRAVENOUS

## 2020-07-24 MED ORDER — PROPOFOL 500 MG/50ML IV EMUL
INTRAVENOUS | Status: DC | PRN
  Administered 2020-07-24: 130 ug/kg/min via INTRAVENOUS

## 2020-07-24 MED ORDER — LIDOCAINE HCL (CARDIAC) PF 100 MG/5ML IV SOSY
PREFILLED_SYRINGE | INTRAVENOUS | Status: DC | PRN
  Administered 2020-07-24: 50 mg via INTRAVENOUS

## 2020-07-24 MED ORDER — SODIUM CHLORIDE 0.9 % IV SOLN: INTRAVENOUS | Status: DC

## 2020-07-24 NOTE — Op Note (Signed)
[facility] Patient Name: Heather Park Procedure Date: 07/24/2020 7:55 AM MRN: [id number] Account #: [id number] Date of Birth: [birth year] Admit Type: Outpatient Age: 40 Room: [facility] ENDO ROOM 2 Gender: Female Note Status: Finalized Procedure:             Upper GI endoscopy Indications:           Heartburn, Esophageal reflux symptoms that persist                         despite appropriate therapy Providers:             Lin Landsman MD, MD Medicines:             General Anesthesia Complications:         No immediate complications. Estimated blood loss: None. Procedure:             Pre-Anesthesia Assessment:                        - Prior to the procedure, a History and Physical was                         performed, and patient medications and allergies were                         reviewed. The patient is competent. The risks and                         benefits of the procedure and the sedation options and                         risks were discussed with the patient. All questions                         were answered and informed consent was obtained.                         Patient identification and proposed procedure were                         verified by the physician, the nurse, the                         anesthesiologist, the anesthetist and the technician                         in the pre-procedure area in the procedure room in the                         endoscopy suite. Mental Status Examination: alert and                         oriented. Airway Examination: normal oropharyngeal                         airway and neck mobility. Respiratory Examination:                         clear to auscultation. CV Examination: normal.  Prophylactic Antibiotics: The patient does not require                         prophylactic antibiotics. Prior Anticoagulants: The                         patient has taken no  previous anticoagulant or                         antiplatelet agents. ASA Grade Assessment: II - A                         patient with mild systemic disease. After reviewing                         the risks and benefits, the patient was deemed in                         satisfactory condition to undergo the procedure. The                         anesthesia plan was to use general anesthesia.                         Immediately prior to administration of medications,                         the patient was re-assessed for adequacy to receive                         sedatives. The heart rate, respiratory rate, oxygen                         saturations, blood pressure, adequacy of pulmonary                         ventilation, and response to care were monitored                         throughout the procedure. The physical status of the                         patient was re-assessed after the procedure.                        After obtaining informed consent, the endoscope was                         passed under direct vision. Throughout the procedure,                         the patient's blood pressure, pulse, and oxygen                         saturations were monitored continuously. The Endoscope                         was introduced through the mouth, and advanced to the  second part of duodenum. The upper GI endoscopy was                         accomplished without difficulty. The patient tolerated                         the procedure well. Findings:      The duodenal bulb and second portion of the duodenum were normal.      The entire examined stomach was normal. Biopsies were taken with a cold       forceps for Helicobacter pylori testing.      The cardia and gastric fundus were normal on retroflexion.      Esophagogastric landmarks were identified: the gastroesophageal junction       was found at 35 cm from the incisors.      The examined esophagus  was normal. Impression:            - Normal duodenal bulb and second portion of the                         duodenum.                        - Normal stomach. Biopsied.                        - Esophagogastric landmarks identified.                        - Normal esophagus. Recommendation:        - Discharge patient to home (with escort).                        - Resume previous diet today.                        - Continue present medications.                        - Await pathology results.                        - Return to my office as previously scheduled. Procedure Code(s):     --- Professional ---                        [phone], Esophagogastroduodenoscopy, flexible,                         transoral; with biopsy, single or multiple Diagnosis Code(s):     --- Professional ---                        R12, Heartburn                        K21.9, Gastro-esophageal reflux disease without                         esophagitis CPT copyright 2019 American Medical Association. All rights reserved. The codes documented in this report are preliminary and upon coder review may  be revised to meet current compliance requirements. Dr. Rod Can  Laurel Harnden Raeanne Gathers MD, MD 07/24/2020 8:30:33 AM This report has been signed electronically. Number of Addenda: 0 Note Initiated On: 07/24/2020 7:55 AM Estimated Blood Loss:  Estimated blood loss: none.      [facility]

## 2020-07-24 NOTE — Anesthesia Postprocedure Evaluation (Signed)
Anesthesia Post Note  Patient: Heather Park  Procedure(s) Performed: ESOPHAGOGASTRODUODENOSCOPY (EGD) (N/A )  Patient location during evaluation: Endoscopy Anesthesia Type: General Level of consciousness: awake and alert Pain management: pain level controlled Vital Signs Assessment: post-procedure vital signs reviewed and stable Respiratory status: spontaneous breathing, nonlabored ventilation, respiratory function stable and patient connected to nasal cannula oxygen Cardiovascular status: blood pressure returned to baseline and stable Postop Assessment: no apparent nausea or vomiting Anesthetic complications: no   No complications documented.   Last Vitals:  Vitals:   07/24/20 0851 07/24/20 0857  BP: 112/79 111/72  Pulse: 95 93  Resp: (!) 21 18  Temp:    SpO2: 100% 100%    Last Pain:  Vitals:   07/24/20 0857  TempSrc:   PainSc: 0-No pain                 Martha Clan

## 2020-07-24 NOTE — Transfer of Care (Signed)
Immediate Anesthesia Transfer of Care Note  Patient: Heather Park  Procedure(s) Performed: ESOPHAGOGASTRODUODENOSCOPY (EGD) (N/A )  Patient Location: PACU and Endoscopy Unit  Anesthesia Type:General  Level of Consciousness: drowsy  Airway & Oxygen Therapy: Patient Spontanous Breathing  Post-op Assessment: Report given to RN  Post vital signs: stable  Last Vitals:  Vitals Value Taken Time  BP    Temp    Pulse    Resp    SpO2      Last Pain:  Vitals:   07/24/20 0720  TempSrc: Temporal  PainSc: 0-No pain         Complications: No complications documented.

## 2020-07-24 NOTE — H&P (Signed)
Heather Darby, MD [address]  [place]  [place], [place] [address]  Main: [phone]  Fax: [phone] Pager: [phone]  Primary Care Physician:  [name], NP Primary Gastroenterologist:  Dr. Cephas Park  Pre-Procedure History & Physical: HPI:  Heather Park is a 40 y.o. female is here for an endoscopy.   Past Medical History:  Diagnosis Date  . Breast mass 07/25/2016   palp lump left breast 4 o'clock left breast x 3 years-increases in size with periods  . Dog bite of right hand 11/24/2018  . GERD (gastroesophageal reflux disease)   . Thoracic back pain 05/12/2018    Past Surgical History:  Procedure Laterality Date  . CESAREAN SECTION     x2    Prior to Admission medications   Medication Sig Start Date End Date Taking? Authorizing Provider  Ascorbic Acid (VITAMIN C) 100 MG tablet Take 100 mg by mouth daily.     [provider]  b complex vitamins capsule Take 1 capsule by mouth daily.    [provider]  CALCIUM-MAGNESIUM-ZINC PO Take by mouth daily.    [provider]  etonogestrel-ethinyl estradiol (NUVARING) 0.12-0.015 MG/24HR vaginal ring USE VAGINALLY FOR THREE WEEKS THEN ONE WEEK OFF 06/01/20   [provider]  famotidine (PEPCID) 20 MG tablet Take 1 tablet (20 mg total) by mouth at bedtime. For heartburn 11/17/19   [name], NP  ferrous sulfate 325 (65 FE) MG EC tablet Take 325 mg by mouth 3 (three) times daily with meals.    [provider]  FOLIC ACID PO Take by mouth.    [provider]  Multiple Vitamin (MULTIVITAMIN) tablet Take 1 tablet by mouth daily.    [provider]  omeprazole (PRILOSEC) 40 MG capsule Take 1 capsule (40 mg total) by mouth daily before breakfast. 06/19/20 07/19/20  Lin Landsman, MD    Allergies as of 07/21/2020 - Review Complete 07/21/2020  Allergen Reaction Noted  . Avocado Itching 05/08/2018  . Oxycodone Itching 05/08/2018     Family History  Problem Relation Age of Onset  . Diabetes Father   . Heart attack Maternal Grandmother   . Breast cancer Paternal Aunt 39  . Leukemia Maternal Uncle   . Healthy Mother   . Prostate cancer Paternal Uncle   . Graves' disease Sister     Social History   Socioeconomic History  . Marital status: Married    Spouse name: Not on file  . Number of children: Not on file  . Years of education: Not on file  . Highest education level: Not on file  Occupational History  . Not on file  Tobacco Use  . Smoking status: Never Smoker  . Smokeless tobacco: Never Used  Vaping Use  . Vaping Use: Never used  Substance and Sexual Activity  . Alcohol use: Yes    Comment: socially  . Drug use: No  . Sexual activity: Not on file  Other Topics Concern  . Not on file  Social History Narrative   Married.   2 children.   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    Social Determinants of Health   Financial Resource Strain: Not on file  Food Insecurity: Not on file  Transportation Needs: Not on file  Physical Activity: Not on file  Stress: Not on file  Social Connections: Not on file  Intimate Partner Violence: Not on file    Review  of Systems: See HPI, otherwise negative ROS  Physical Exam: BP 111/80   Pulse 80   Temp 97.8 F (36.6 C) (Temporal)   Resp 16   Ht 5' (1.524 m)   Wt 53.1 kg   LMP 07/04/2020   SpO2 100%   BMI 22.85 kg/m  General:   Alert,  pleasant and cooperative in NAD Head:  Normocephalic and atraumatic. Neck:  Supple; no masses or thyromegaly. Lungs:  Clear throughout to auscultation.    Heart:  Regular rate and rhythm. Abdomen:  Soft, nontender and nondistended. Normal bowel sounds, without guarding, and without rebound.   Neurologic:  Alert and  oriented x4;  grossly normal neurologically.  Impression/Plan: Heather Park is here for an endoscopy to be performed for chronic gerd  Risks, benefits,  limitations, and alternatives regarding  endoscopy have been reviewed with the patient.  Questions have been answered.  All parties agreeable.   [name], MD  07/24/2020, 8:14 AM

## 2020-07-24 NOTE — Anesthesia Preprocedure Evaluation (Signed)
Anesthesia Evaluation  Patient identified by MRN, date of birth, ID band Patient awake    Reviewed: Allergy & Precautions, H&P , NPO status , Patient's Chart, lab work & pertinent test results, reviewed documented beta blocker date and time   History of Anesthesia Complications Negative for: history of anesthetic complications  Airway Mallampati: II  TM Distance: >3 FB Neck ROM: full    Dental  (+) Caps, Dental Advidsory Given, Teeth Intact   Pulmonary neg pulmonary ROS,    Pulmonary exam normal breath sounds clear to auscultation       Cardiovascular Exercise Tolerance: Good negative cardio ROS Normal cardiovascular exam Rhythm:regular Rate:Normal     Neuro/Psych negative neurological ROS  negative psych ROS   GI/Hepatic Neg liver ROS, GERD  ,  Endo/Other  negative endocrine ROS  Renal/GU negative Renal ROS  negative genitourinary   Musculoskeletal   Abdominal   Peds  Hematology negative hematology ROS (+)   Anesthesia Other Findings Past Medical History: 07/25/2016: Breast mass     Comment:  palp lump left breast 4 o'clock left breast x 3               years-increases in size with periods 11/24/2018: Dog bite of right hand No date: GERD (gastroesophageal reflux disease) 05/12/2018: Thoracic back pain   Reproductive/Obstetrics negative OB ROS                             Anesthesia Physical Anesthesia Plan  ASA: II  Anesthesia Plan: General   Post-op Pain Management:    Induction: Intravenous  PONV Risk Score and Plan: 3 and TIVA and Propofol infusion  Airway Management Planned: Natural Airway and Nasal Cannula  Additional Equipment:   Intra-op Plan:   Post-operative Plan:   Informed Consent: I have reviewed the patients History and Physical, chart, labs and discussed the procedure including the risks, benefits and alternatives for the proposed anesthesia with the  patient or authorized representative who has indicated his/her understanding and acceptance.     Dental Advisory Given  Plan Discussed with: Anesthesiologist, CRNA and Surgeon  Anesthesia Plan Comments:         Anesthesia Quick Evaluation

## 2020-07-25 ENCOUNTER — Encounter: Payer: Self-pay | Admitting: Gastroenterology

## 2020-07-25 LAB — SURGICAL PATHOLOGY

## 2021-09-01 IMAGING — DX DG CHEST 2V
2 series · 2 of 2 positions shown · non-contrast
Comparison: 05/12/2018

CLINICAL DATA: Positive PPD test, employment exam, asymptomatic

EXAM:
CHEST - 2 VIEW

[chest pa]
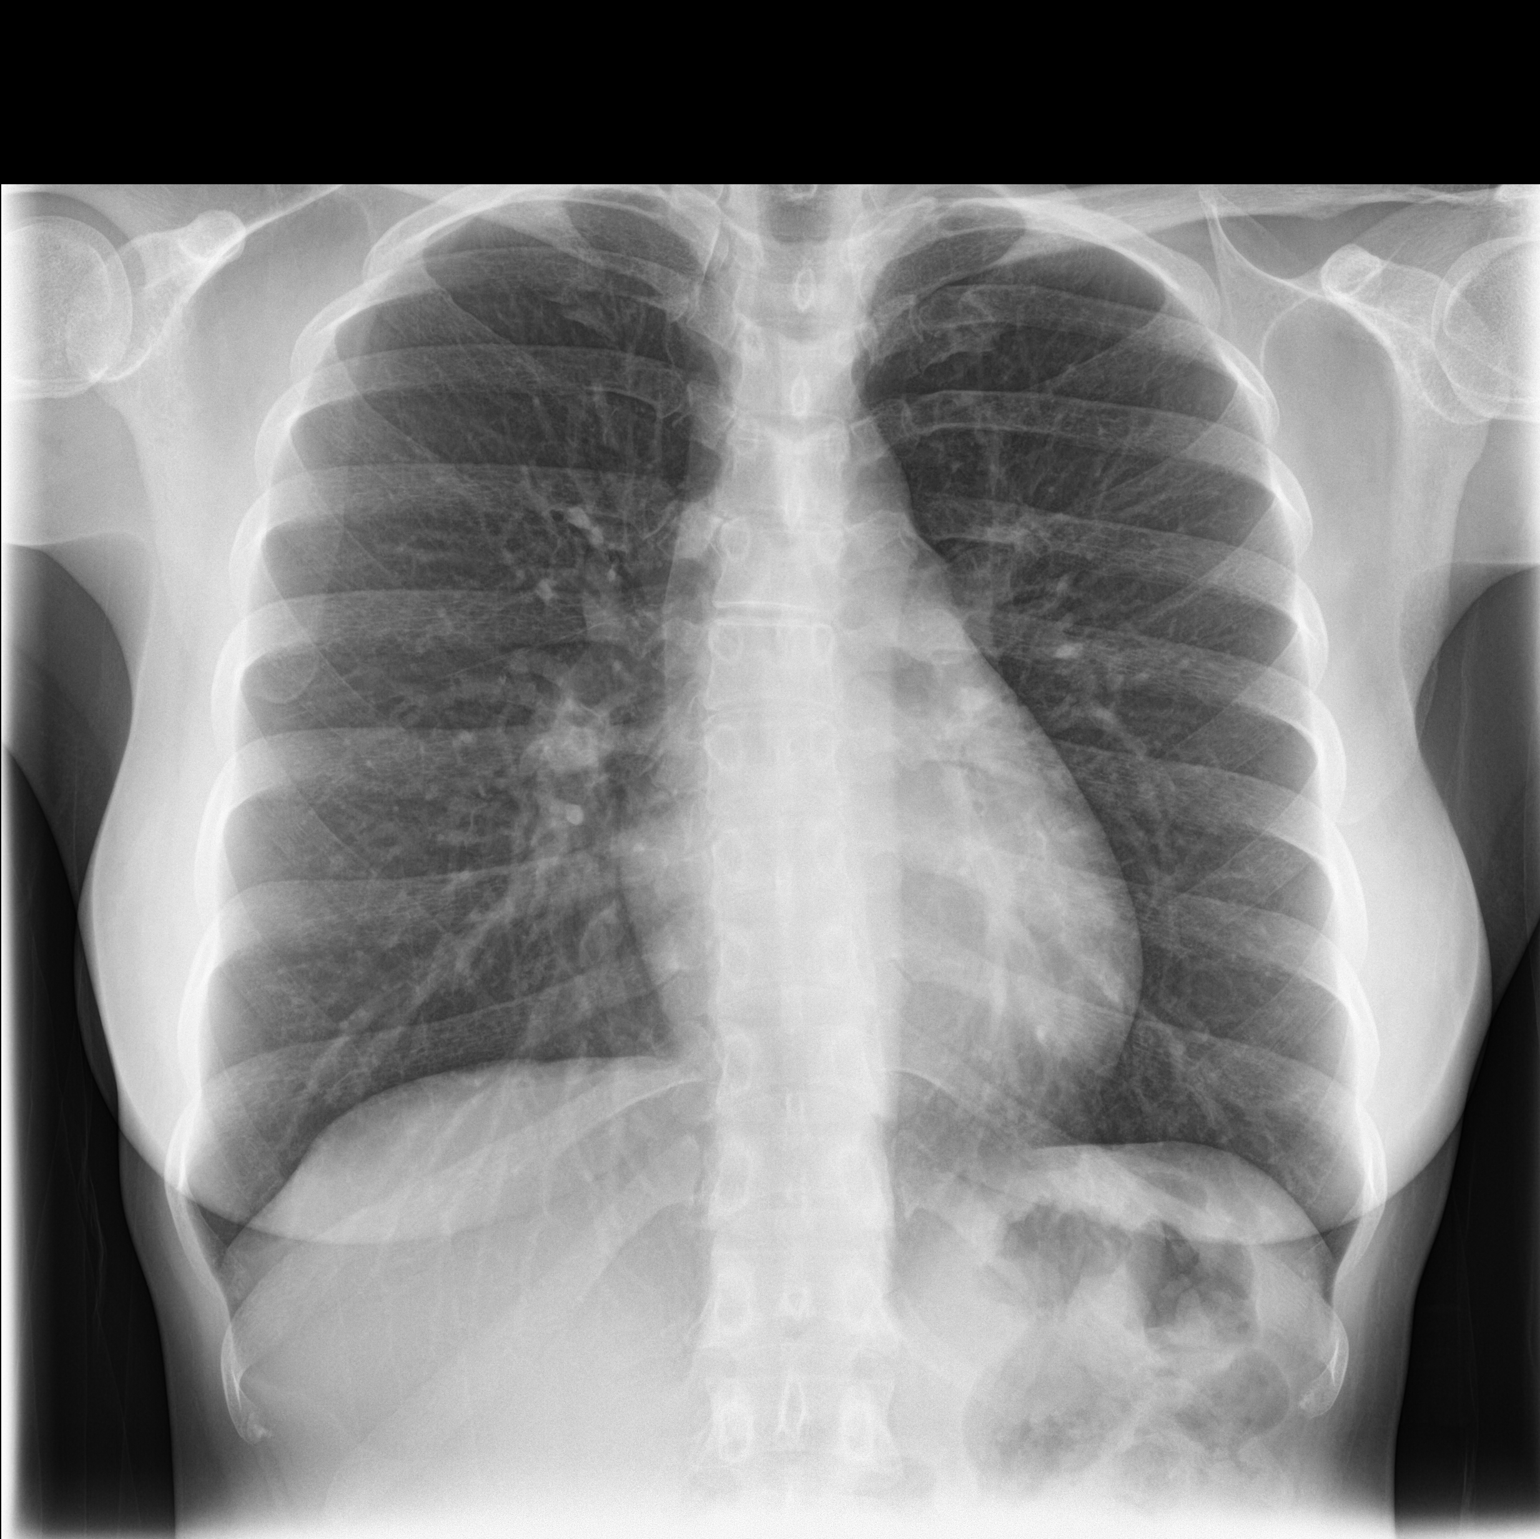

[chest lat]
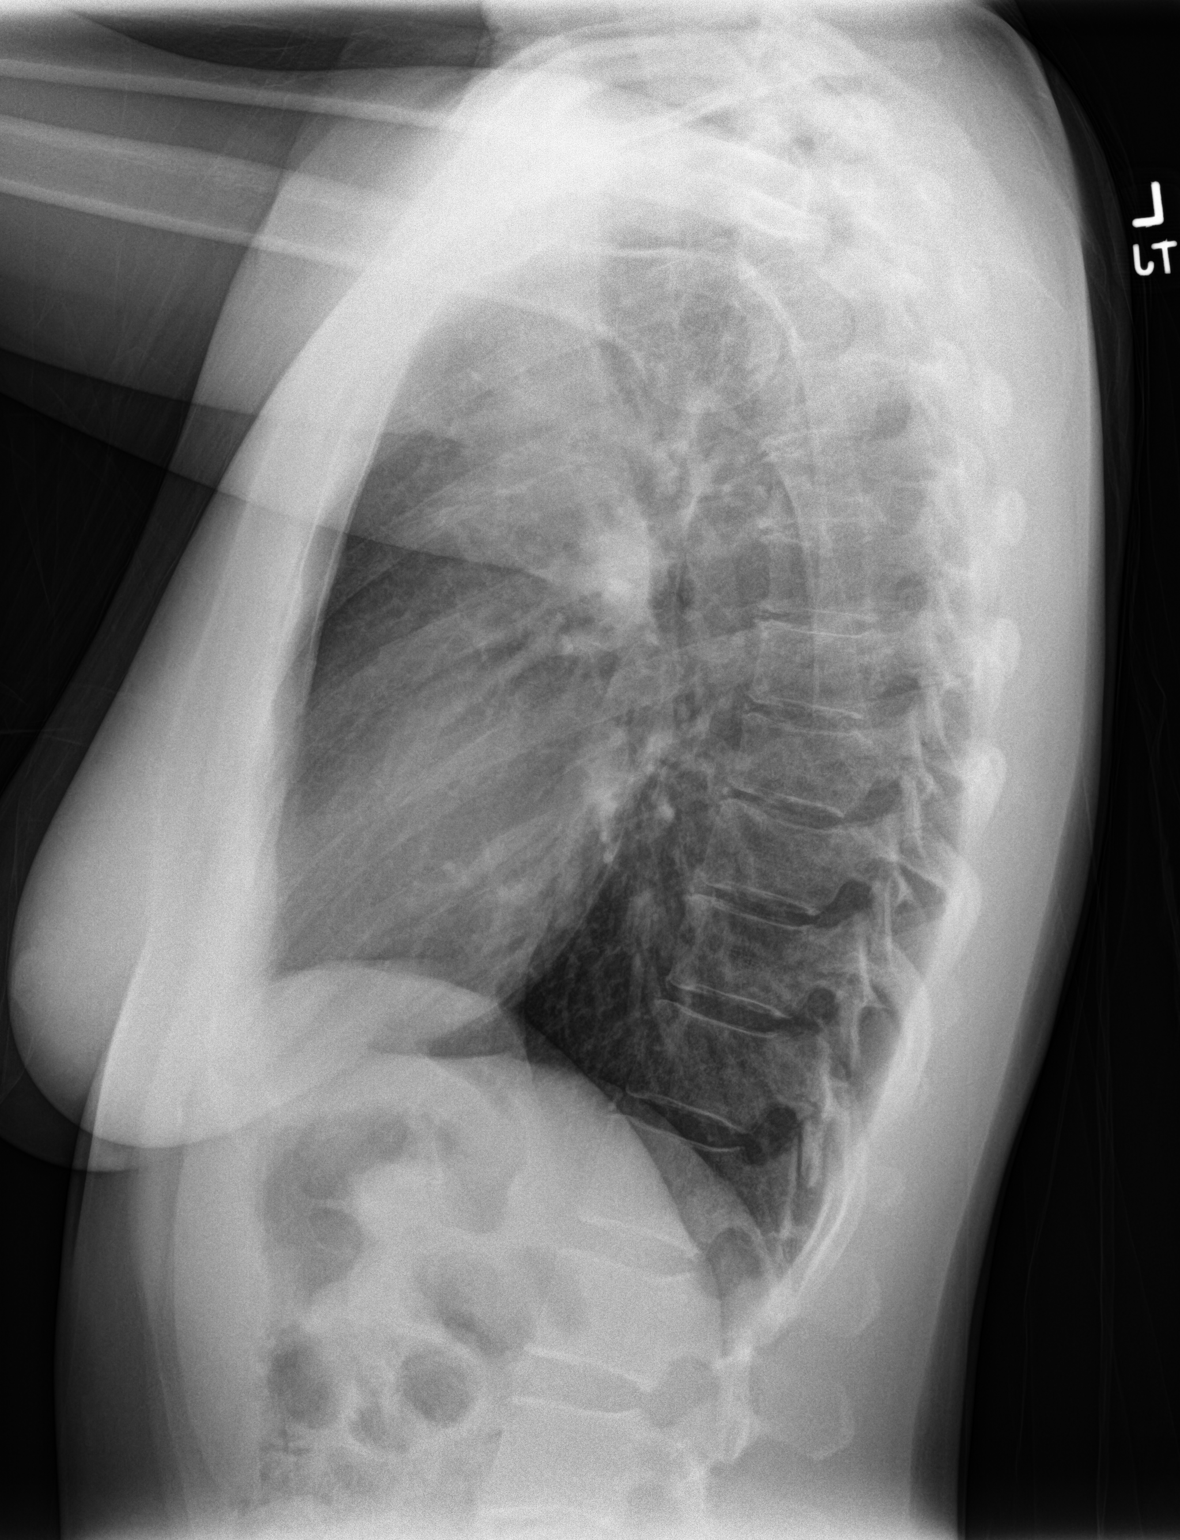

[2 of 2 positions shown; findings below may reference images not displayed]

FINDINGS: Normal heart size, mediastinal contours, and pulmonary vascularity.

Lungs clear.

No pleural effusion or pneumothorax.

Bones unremarkable.
IMPRESSION: Normal exam.

## 2021-09-05 ENCOUNTER — Ambulatory Visit (INDEPENDENT_AMBULATORY_CARE_PROVIDER_SITE_OTHER): Payer: Commercial Managed Care - PPO | Admitting: Primary Care

## 2021-09-05 ENCOUNTER — Encounter: Payer: Self-pay | Admitting: Primary Care

## 2021-09-05 VITALS — BP 106/70 | HR 77 | Ht 60.5 in | Wt 123.4 lb

## 2021-09-05 DIAGNOSIS — E559 Vitamin D deficiency, unspecified: Secondary | ICD-10-CM | POA: Diagnosis not present

## 2021-09-05 DIAGNOSIS — N920 Excessive and frequent menstruation with regular cycle: Secondary | ICD-10-CM

## 2021-09-05 DIAGNOSIS — R5383 Other fatigue: Secondary | ICD-10-CM

## 2021-09-05 DIAGNOSIS — Z0000 Encounter for general adult medical examination without abnormal findings: Secondary | ICD-10-CM | POA: Diagnosis not present

## 2021-09-05 DIAGNOSIS — K219 Gastro-esophageal reflux disease without esophagitis: Secondary | ICD-10-CM

## 2021-09-05 LAB — COMPREHENSIVE METABOLIC PANEL
ALT: 18 U/L (ref 0–35)
AST: 20 U/L (ref 0–37)
Albumin: 4.2 g/dL (ref 3.5–5.2)
Alkaline Phosphatase: 40 U/L (ref 39–117)
BUN: 12 mg/dL (ref 6–23)
CO2: 29 mEq/L (ref 19–32)
Calcium: 9.1 mg/dL (ref 8.4–10.5)
Chloride: 104 mEq/L (ref 96–112)
Creatinine, Ser: 0.8 mg/dL (ref 0.40–1.20)
GFR: 92.75 mL/min (ref >60.00)
Glucose, Bld: 96 mg/dL (ref 70–99)
Potassium: 4.4 mEq/L (ref 3.5–5.1)
Sodium: 138 mEq/L (ref 135–145)
Total Bilirubin: 0.5 mg/dL (ref 0.2–1.2)
Total Protein: 7.3 g/dL (ref 6.0–8.3)

## 2021-09-05 LAB — LIPID PANEL
Cholesterol: 214 mg/dL — ABNORMAL HIGH (ref 0–200)
HDL: 96.4 mg/dL (ref >39.00)
LDL Cholesterol: 101 mg/dL — ABNORMAL HIGH (ref 0–99)
NonHDL: 117.56
Total CHOL/HDL Ratio: 2
Triglycerides: 83 mg/dL (ref 0.0–149.0)
VLDL: 16.6 mg/dL (ref 0.0–40.0)

## 2021-09-05 LAB — CBC
HCT: 39.7 % (ref 36.0–46.0)
Hemoglobin: 13 g/dL (ref 12.0–15.0)
MCHC: 32.8 g/dL (ref 30.0–36.0)
MCV: 82 fl (ref 78.0–100.0)
Platelets: 162 10*3/uL (ref 150.0–400.0)
RBC: 4.85 Mil/uL (ref 3.87–5.11)
RDW: 13.3 % (ref 11.5–15.5)
WBC: 5.1 10*3/uL (ref 4.0–10.5)

## 2021-09-05 LAB — IBC + FERRITIN
Ferritin: 37.3 ng/mL (ref 10.0–291.0)
Iron: 100 ug/dL (ref 42–145)
Saturation Ratios: 27.8 % (ref 20.0–50.0)
TIBC: 359.8 ug/dL (ref 250.0–450.0)
Transferrin: 257 mg/dL (ref 212.0–360.0)

## 2021-09-05 LAB — TSH: TSH: 0.4 u[IU]/mL (ref 0.35–5.50)

## 2021-09-05 LAB — VITAMIN D 25 HYDROXY (VIT D DEFICIENCY, FRACTURES): VITD: 34.47 ng/mL (ref 30.00–100.00)

## 2021-09-05 NOTE — Patient Instructions (Signed)
Stop by the lab prior to leaving today. I will notify you of your results once received.  ? ?It was a pleasure to see you today! ? ?Preventive Care 40-40 Years Old, Female ?Preventive care refers to lifestyle choices and visits with your health care provider that can promote health and wellness. Preventive care visits are also called wellness exams. ?What can I expect for my preventive care visit? ?Counseling ?During your preventive care visit, your health care provider may ask about your: ?Medical history, including: ?Past medical problems. ?Family medical history. ?Pregnancy history. ?Current health, including: ?Menstrual cycle. ?Method of birth control. ?Emotional well-being. ?Home life and relationship well-being. ?Sexual activity and sexual health. ?Lifestyle, including: ?Alcohol, nicotine or tobacco, and drug use. ?Access to firearms. ?Diet, exercise, and sleep habits. ?Work and work Statistician. ?Sunscreen use. ?Safety issues such as seatbelt and bike helmet use. ?Physical exam ?Your health care provider may check your: ?Height and weight. These may be used to calculate your BMI (body mass index). BMI is a measurement that tells if you are at a healthy weight. ?Waist circumference. This measures the distance around your waistline. This measurement also tells if you are at a healthy weight and may help predict your risk of certain diseases, such as type 2 diabetes and high blood pressure. ?Heart rate and blood pressure. ?Body temperature. ?Skin for abnormal spots. ?What immunizations do I need? ? ?Vaccines are usually given at various ages, according to a schedule. Your health care provider will recommend vaccines for you based on your age, medical history, and lifestyle or other factors, such as travel or where you work. ?What tests do I need? ?Screening ?Your health care provider may recommend screening tests for certain conditions. This may include: ?Pelvic exam and Pap test. ?Lipid and cholesterol  levels. ?Diabetes screening. This is done by checking your blood sugar (glucose) after you have not eaten for a while (fasting). ?Hepatitis B test. ?Hepatitis C test. ?HIV (human immunodeficiency virus) test. ?STI (sexually transmitted infection) testing, if you are at risk. ?BRCA-related cancer screening. This may be done if you have a family history of breast, ovarian, tubal, or peritoneal cancers. ?Talk with your health care provider about your test results, treatment options, and if necessary, the need for more tests. ?Follow these instructions at home: ?Eating and drinking ? ?Eat a healthy diet that includes fresh fruits and vegetables, whole grains, lean protein, and low-fat dairy products. ?Take vitamin and mineral supplements as recommended by your health care provider. ?Do not drink alcohol if: ?Your health care provider tells you not to drink. ?You are pregnant, may be pregnant, or are planning to become pregnant. ?If you drink alcohol: ?Limit how much you have to 0-1 drink a day. ?Know how much alcohol is in your drink. In the [place] one drink equals one 12 oz bottle of beer (355 mL), one 5 oz glass of wine (148 mL), or one 1? oz glass of hard liquor (44 mL). ?Lifestyle ?Brush your teeth every morning and night with fluoride toothpaste. Floss one time each day. ?Exercise for at least 30 minutes 5 or more days each week. ?Do not use any products that contain nicotine or tobacco. These products include cigarettes, chewing tobacco, and vaping devices, such as e-cigarettes. If you need help quitting, ask your health care provider. ?Do not use drugs. ?If you are sexually active, practice safe sex. Use a condom or other form of protection to prevent STIs. ?If you do not wish to become pregnant, use a  form of birth control. If you plan to become pregnant, see your health care provider for a prepregnancy visit. ?Find healthy ways to manage stress, such as: ?Meditation, yoga, or listening to  music. ?Journaling. ?Talking to a trusted person. ?Spending time with friends and family. ?Minimize exposure to UV radiation to reduce your risk of skin cancer. ?Safety ?Always wear your seat belt while driving or riding in a vehicle. ?Do not drive: ?If you have been drinking alcohol. Do not ride with someone who has been drinking. ?If you have been using any mind-altering substances or drugs. ?While texting. ?When you are tired or distracted. ?Wear a helmet and other protective equipment during sports activities. ?If you have firearms in your house, make sure you follow all gun safety procedures. ?Seek help if you have been physically or sexually abused. ?What's next? ?Go to your health care provider once a year for an annual wellness visit. ?Ask your health care provider how often you should have your eyes and teeth checked. ?Stay up to date on all vaccines. ?This information is not intended to replace advice given to you by your health care provider. Make sure you discuss any questions you have with your health care provider. ?Document Revised: 10/18/2020 Document Reviewed: 10/18/2020 ?Elsevier Patient Education ? [place]. ? ?

## 2021-09-05 NOTE — Assessment & Plan Note (Signed)
Improved with Nuva Ring. ? ?Repeat CBC and iron studies. ? ?

## 2021-09-05 NOTE — Assessment & Plan Note (Signed)
Checking labs to rule out metabolic cause. ? ? ?

## 2021-09-05 NOTE — Assessment & Plan Note (Signed)
Intermittent, is aware of triggers. ?Continue Tums or Pepcid as needed. ?

## 2021-09-05 NOTE — Progress Notes (Signed)
? ?Subjective:  ? ? Patient ID: Heather Park, female    DOB: 1980/07/14, 41 y.o.   MRN: [id number] ? ?HPI ? ?Heather Park is a very pleasant 41 y.o. female who presents today for complete physical and follow up of chronic conditions. ? ?She would also like to discuss several symptoms which include chronic fatigue, feeling cold a lot. She has a family history of thyroid disease maternal and maternal grandmothers twin sister. She has two PRN jobs, working four days weekly. She suspects her fatigue is secondary to her workload, but she would like to rule out thyroid disease given her family history.  She does have a history of vitamin D deficiency and anemia.  She is compliant to oral iron daily. ? ?Immunizations: ?-Tetanus: 2020 ?-Influenza: Completed last season ?-Covid-19: Has not completed ? ?Diet: Fair diet.  ?Exercise: No regular exercise. Active at work.  ? ?Eye exam: Completed years ago ?Dental exam: Completes semi-annually  ? ?Pap Smear: Completed in 2021 ? ? ?BP Readings from Last 3 Encounters:  ?09/05/21 106/70  ?07/24/20 111/72  ?06/19/20 126/75  ? ? ? ? ?Review of Systems  ?Constitutional:  Positive for fatigue. Negative for unexpected weight change.  ?HENT:  Negative for rhinorrhea.   ?Respiratory:  Negative for cough and shortness of breath.   ?Cardiovascular:  Negative for chest pain.  ?Gastrointestinal:  Negative for constipation and diarrhea.  ?Genitourinary:  Negative for difficulty urinating and menstrual problem.  ?Musculoskeletal:  Negative for arthralgias and myalgias.  ?Skin:  Negative for rash.  ?Allergic/Immunologic: Negative for environmental allergies.  ?Neurological:  Negative for dizziness, numbness and headaches.  ?Psychiatric/Behavioral:  The patient is not nervous/anxious.   ? ?   ? ? ?Past Medical History:  ?Diagnosis Date  ? Breast mass 07/25/2016  ? palp lump left breast 4 o'clock left breast x 3 years-increases in size with periods  ? Dog bite of right hand 11/24/2018  ? GERD  (gastroesophageal reflux disease)   ? Thoracic back pain 05/12/2018  ? ? ?Social History  ? ?Socioeconomic History  ? Marital status: Married  ?  Spouse name: Not on file  ? Number of children: Not on file  ? Years of education: Not on file  ? Highest education level: Not on file  ?Occupational History  ? Not on file  ?Tobacco Use  ? Smoking status: Never  ? Smokeless tobacco: Never  ?Vaping Use  ? Vaping Use: Never used  ?Substance and Sexual Activity  ? Alcohol use: Yes  ?  Comment: socially  ? Drug use: No  ? Sexual activity: Not on file  ?Other Topics Concern  ? Not on file  ?Social History Narrative  ? Married.  ? 2 children.  ? 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  ? 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   ? ?Social Determinants of Health  ? ?Financial Resource Strain: Not on file  ?Food Insecurity: Not on file  ?Transportation Needs: Not on file  ?Physical Activity: Not on file  ?Stress: Not on file  ?Social Connections: Not on file  ?Intimate Partner Violence: Not on file  ? ? ?Past Surgical History:  ?Procedure Laterality Date  ? CESAREAN SECTION    ? x2  ? ESOPHAGOGASTRODUODENOSCOPY N/A 07/24/2020  ? Procedure: ESOPHAGOGASTRODUODENOSCOPY (EGD);  Surgeon: Lin Landsman, MD;  Location: [facility] END[facility]COPY;  Service: Gastroenterology;  Laterality: N/A;  ? ? ?Family History  ?Problem Relation Age of Onset  ? Diabetes Father   ? Heart attack Maternal Grandmother   ?  Breast cancer Paternal Aunt 55  ? Leukemia Maternal Uncle   ? Healthy Mother   ? Prostate cancer Paternal Uncle   ? Graves' disease Sister   ? ? ?Allergies  ?Allergen Reactions  ? Avocado Itching  ? Oxycodone Itching  ? ? ?Current Outpatient Medications on File Prior to Visit  ?Medication Sig Dispense Refill  ? Ascorbic Acid (VITAMIN C) 100 MG tablet Take 100 mg by mouth daily.     ? b complex vitamins capsule Take 1 capsule by mouth daily.    ? CALCIUM-MAGNESIUM-ZINC PO Take by mouth daily.    ? etonogestrel-ethinyl estradiol (NUVARING) 0.12-0.015 MG/24HR  vaginal ring USE VAGINALLY FOR THREE WEEKS THEN ONE WEEK OFF    ? famotidine (PEPCID) 20 MG tablet Take 1 tablet (20 mg total) by mouth at bedtime. For heartburn 90 tablet 1  ? ferrous sulfate 325 (65 FE) MG EC tablet Take 325 mg by mouth 3 (three) times daily with meals.    ? FOLIC ACID PO Take by mouth.    ? Multiple Vitamin (MULTIVITAMIN) tablet Take 1 tablet by mouth daily.    ? ?No current facility-administered medications on file prior to visit.  ? ? ?BP 106/70   Pulse 77   Ht 5' 0.5" (1.537 m)   Wt 123 lb 6.4 oz (56 kg)   LMP 08/30/2021   SpO2 98%   BMI 23.70 kg/m?  ?Objective:  ? Physical Exam ?HENT:  ?   Right Ear: Tympanic membrane and ear canal normal.  ?   Left Ear: Tympanic membrane and ear canal normal.  ?   Nose: Nose normal.  ?Eyes:  ?   Conjunctiva/sclera: Conjunctivae normal.  ?   Pupils: Pupils are equal, round, and reactive to light.  ?Neck:  ?   Thyroid: No thyromegaly.  ?Cardiovascular:  ?   Rate and Rhythm: Normal rate and regular rhythm.  ?   Heart sounds: No murmur heard. ?Pulmonary:  ?   Effort: Pulmonary effort is normal.  ?   Breath sounds: Normal breath sounds. No rales.  ?Abdominal:  ?   General: Bowel sounds are normal.  ?   Palpations: Abdomen is soft.  ?   Tenderness: There is no abdominal tenderness.  ?Musculoskeletal:     ?   General: Normal range of motion.  ?   Cervical back: Neck supple.  ?Lymphadenopathy:  ?   Cervical: No cervical adenopathy.  ?Skin: ?   General: Skin is warm and dry.  ?   Findings: No rash.  ?Neurological:  ?   Mental Status: She is alert and oriented to person, place, and time.  ?   Cranial Nerves: No cranial nerve deficit.  ?   Deep Tendon Reflexes: Reflexes are normal and symmetric.  ?Psychiatric:     ?   Mood and Affect: Mood normal.  ? ? ? ? ? ?   ?Assessment & Plan:  ? ? ? ? ?This visit occurred during the SARS-CoV-2 public health emergency.  Safety protocols were in place, including screening questions prior to the visit, additional usage of  staff PPE, and extensive cleaning of exam room while observing appropriate contact time as indicated for disinfecting solutions.  ?

## 2021-09-09 ENCOUNTER — Encounter: Payer: Self-pay | Admitting: Emergency Medicine

## 2021-09-09 ENCOUNTER — Ambulatory Visit
Admission: EM | Admit: 2021-09-09 | Discharge: 2021-09-09 | Disposition: A | Discharge status: Home / Self Care | Payer: Commercial Managed Care - PPO | Attending: Emergency Medicine | Admitting: Emergency Medicine

## 2021-09-09 DIAGNOSIS — L237 Allergic contact dermatitis due to plants, except food: Secondary | ICD-10-CM | POA: Diagnosis not present

## 2021-09-09 MED ORDER — PREDNISONE 10 MG (21) PO TBPK: ORAL_TABLET | Freq: Every day | ORAL | 0 refills | Status: DC

## 2021-09-09 NOTE — ED Triage Notes (Signed)
Pt presents with poison oak on her stomach, face and arms x 1 week.  ?

## 2021-09-09 NOTE — ED Provider Notes (Signed)
?[facility] ? ? ? ?CSN: [id number] ?Arrival date & time: 09/09/21  1207 ? ? ?  ? ?History   ?Chief Complaint ?Chief Complaint  ?Patient presents with  ? Rash  ? ? ?HPI ?Heather Park is a 41 y.o. female.  Patient presents with 1 week history of pruritic rash on her face, trunk, and arms after she came in contact with poison oak.  Treatment attempted with OTC antihistamine without relief.  She denies fever, sore throat, cough, shortness of breath, vomiting, diarrhea, or other symptoms.   ? ?The history is provided by the patient and medical records.  ? ?Past Medical History:  ?Diagnosis Date  ? Breast mass 07/25/2016  ? palp lump left breast 4 o'clock left breast x 3 years-increases in size with periods  ? Dog bite of right hand 11/24/2018  ? GERD (gastroesophageal reflux disease)   ? Thoracic back pain 05/12/2018  ? ? ?Patient Active Problem List  ? Diagnosis Date Noted  ? Menorrhagia with regular cycle 04/03/2020  ? GERD (gastroesophageal reflux disease) 05/12/2018  ? Arthralgia 01/29/2018  ? Fatigue 06/18/2017  ? Fibroadenoma of left breast 12/25/2016  ? Preventative health care 07/16/2016  ? Breast mass 07/16/2016  ? ? ?Past Surgical History:  ?Procedure Laterality Date  ? CESAREAN SECTION    ? x2  ? ESOPHAGOGASTRODUODENOSCOPY N/A 07/24/2020  ? Procedure: ESOPHAGOGASTRODUODENOSCOPY (EGD);  Surgeon: Lin Landsman, MD;  Location: [facility] ENDOSCOPY;  Service: Gastroenterology;  Laterality: N/A;  ? ? ?OB History   ? ? Gravida  ?2  ? Para  ?2  ? Term  ?   ? Preterm  ?   ? AB  ?   ? Living  ?   ?  ? ? SAB  ?   ? IAB  ?   ? Ectopic  ?   ? Multiple  ?   ? Live Births  ?   ?   ?  ? Obstetric Comments  ?1st Menstrual Cycle: 13   ?1st Pregnancy:  28 ?  ?  ? ?  ? ? ? ?Home Medications   ? ?Prior to Admission medications   ?Medication Sig Start Date End Date Taking? Authorizing Provider  ?predniSONE (STERAPRED UNI-PAK 21 TAB) 10 MG (21) TBPK tablet Take by mouth daily. As directed 09/09/21  Yes Sharion Balloon, NP   ?Ascorbic Acid (VITAMIN C) 100 MG tablet Take 100 mg by mouth daily.     [provider]  ?b complex vitamins capsule Take 1 capsule by mouth daily.    [provider]  ?CALCIUM-MAGNESIUM-ZINC PO Take by mouth daily.    [provider]  ?etonogestrel-ethinyl estradiol (NUVARING) 0.12-0.015 MG/24HR vaginal ring USE VAGINALLY FOR THREE WEEKS THEN ONE WEEK OFF 06/01/20   [provider]  ?famotidine (PEPCID) 20 MG tablet Take 1 tablet (20 mg total) by mouth at bedtime. For heartburn 11/17/19   [name], NP  ?ferrous sulfate 325 (65 FE) MG EC tablet Take 325 mg by mouth 3 (three) times daily with meals.    [provider]  ?FOLIC ACID PO Take by mouth.    [provider]  ?Multiple Vitamin (MULTIVITAMIN) tablet Take 1 tablet by mouth daily.    [provider]  ? ? ?Family History ?Family History  ?Problem Relation Age of Onset  ? Diabetes Father   ? Heart attack Maternal Grandmother   ? Breast cancer Paternal Aunt 36  ? Leukemia Maternal Uncle   ? Healthy Mother   ?  Prostate cancer Paternal Uncle   ? Graves' disease Sister   ? ? ?Social History ?Social History  ? ?Tobacco Use  ? Smoking status: Never  ? Smokeless tobacco: Never  ?Vaping Use  ? Vaping Use: Never used  ?Substance Use Topics  ? Alcohol use: Yes  ?  Comment: socially  ? Drug use: No  ? ? ? ?Allergies   ?Avocado and Oxycodone ? ? ?Review of Systems ?Review of Systems  ?Constitutional:  Negative for chills and fever.  ?HENT:  Negative for ear pain and sore throat.   ?Respiratory:  Negative for cough and shortness of breath.   ?Cardiovascular:  Negative for chest pain and palpitations.  ?Gastrointestinal:  Negative for diarrhea and vomiting.  ?Skin:  Positive for rash. Negative for color change.  ?All other systems reviewed and are negative. ? ? ?Physical Exam ?Triage Vital Signs ?ED Triage Vitals  ?Enc Vitals Group  ?   BP 09/09/21 1235 121/86  ?   Pulse Rate 09/09/21 1235 81  ?   Resp  09/09/21 1235 18  ?   Temp 09/09/21 1235 98.1 ?F (36.7 ?C)  ?   Temp src --   ?   SpO2 09/09/21 1235 98 %  ?   Weight --   ?   Height --   ?   Head Circumference --   ?   Peak Flow --   ?   Pain Score 09/09/21 1238 0  ?   Pain Loc --   ?   Pain Edu? --   ?   Excl. in [place]? --   ? ?No data found. ? ?Updated Vital Signs ?BP 121/86   Pulse 81   Temp 98.1 ?F (36.7 ?C)   Resp 18   LMP 08/30/2021   SpO2 98%  ? ?Visual Acuity ?Right Eye Distance:   ?Left Eye Distance:   ?Bilateral Distance:   ? ?Right Eye Near:   ?Left Eye Near:    ?Bilateral Near:    ? ?Physical Exam ?Vitals and nursing note reviewed.  ?Constitutional:   ?   General: She is not in acute distress. ?   Appearance: Normal appearance. She is well-developed. She is not ill-appearing.  ?HENT:  ?   Mouth/Throat:  ?   Mouth: Mucous membranes are moist.  ?   Pharynx: Oropharynx is clear.  ?Eyes:  ?   Extraocular Movements: Extraocular movements intact.  ?   Conjunctiva/sclera: Conjunctivae normal.  ?   Pupils: Pupils are equal, round, and reactive to light.  ?Cardiovascular:  ?   Rate and Rhythm: Normal rate and regular rhythm.  ?   Heart sounds: Normal heart sounds.  ?Pulmonary:  ?   Effort: Pulmonary effort is normal. No respiratory distress.  ?   Breath sounds: Normal breath sounds.  ?Musculoskeletal:  ?   Cervical back: Neck supple.  ?Skin: ?   General: Skin is warm and dry.  ?   Findings: Rash present.  ?   Comments: Red patchy papular rash on face, trunk, and arms.  No lesions in eyes or mouth.  ?Neurological:  ?   Mental Status: She is alert.  ?Psychiatric:     ?   Mood and Affect: Mood normal.     ?   Behavior: Behavior normal.  ? ? ? ?UC Treatments / Results  ?Labs ?(all labs ordered are listed, but only abnormal results are displayed) ?Labs Reviewed - No data to display ? ?EKG ? ? ?Radiology ?No results found. ? ?  Procedures ?Procedures (including critical care time) ? ?Medications Ordered in UC ?Medications - No data to display ? ?Initial Impression /  Assessment and Plan / UC Course  ?I have reviewed the triage vital signs and the nursing notes. ? ?Pertinent labs & imaging results that were available during my care of the patient were reviewed by me and considered in my medical decision making (see chart for details). ? ?  ?Poison oak dermatitis.  Treating with prednisone taper; Benadryl or Zyrtec.  Instructed patient to apply calamine lotion to the rash on her trunk and arms but to avoid her face.  She declines steroid injection today.  Instructed patient to follow up with her PCP if her symptoms are not improving.  She agrees to plan of care.  ? ? ?Final Clinical Impressions(s) / UC Diagnoses  ? ?Final diagnoses:  ?Poison oak dermatitis  ? ? ? ?Discharge Instructions   ? ?  ?Take Benadryl or Zyrtec as directed.  Take the prednisone taper as directed.  Apply calamine lotion to the rash.  Follow up with your primary care provider if your symptoms are not improving.   ? ? ? ? ? ?ED Prescriptions   ? ? Medication Sig Dispense Auth. Provider  ? predniSONE (STERAPRED UNI-PAK 21 TAB) 10 MG (21) TBPK tablet Take by mouth daily. As directed 21 tablet Sharion Balloon, NP  ? ?  ? ?PDMP not reviewed this encounter. ?  ?Sharion Balloon, NP ?09/09/21 1310 ? ?

## 2021-09-09 NOTE — Discharge Instructions (Addendum)
Take Benadryl or Zyrtec as directed.  Take the prednisone taper as directed.  Apply calamine lotion to the rash.  Follow up with your primary care provider if your symptoms are not improving.   ? ?

## 2021-10-08 ENCOUNTER — Telehealth: Payer: Self-pay | Admitting: Primary Care

## 2021-10-08 ENCOUNTER — Encounter: Payer: Self-pay | Admitting: Emergency Medicine

## 2021-10-08 ENCOUNTER — Ambulatory Visit (INDEPENDENT_AMBULATORY_CARE_PROVIDER_SITE_OTHER): Payer: Commercial Managed Care - PPO

## 2021-10-08 ENCOUNTER — Ambulatory Visit
Admission: EM | Admit: 2021-10-08 | Discharge: 2021-10-08 | Disposition: A | Discharge status: Home / Self Care | Payer: Commercial Managed Care - PPO | Attending: Emergency Medicine | Admitting: Emergency Medicine

## 2021-10-08 DIAGNOSIS — W5501XA Bitten by cat, initial encounter: Secondary | ICD-10-CM

## 2021-10-08 DIAGNOSIS — S61432A Puncture wound without foreign body of left hand, initial encounter: Secondary | ICD-10-CM

## 2021-10-08 DIAGNOSIS — M79642 Pain in left hand: Secondary | ICD-10-CM

## 2021-10-08 MED ORDER — AMOXICILLIN-POT CLAVULANATE 875-125 MG PO TABS: 1.0000 | ORAL_TABLET | Freq: Two times a day (BID) | ORAL | 0 refills | Status: DC

## 2021-10-08 NOTE — Telephone Encounter (Signed)
Pt called because she was transferred to a triage nurse because of a cat bite and they told her to be seen within 3 to 4 hours, our office and [facility] doesn't have any openings so I offered for [facility] but she said she doesn't want to drive that far. She disconnected the call

## 2021-10-08 NOTE — ED Triage Notes (Signed)
Pts cat bit her left hand today. She has pain and swelling.

## 2021-10-08 NOTE — ED Provider Notes (Signed)
Heather Park    CSN: [id number] Arrival date & time: 10/08/21  1509      History   Chief Complaint Chief Complaint  Patient presents with   Animal Bite    HPI Heather Park is a 41 y.o. female.   Patient presents with cat bite to the left hand occurring today approximately 12 PM.  Cat is patient's pet that she got from a shelter 8 months ago, endorses that she believes that the animal is up-to-date on vaccinations.  Has been experiencing pain and swelling over the site since occurrence.  Limited range of motion of the index finger.  Has been icing.  Denies numbness or tingling.  Past Medical History:  Diagnosis Date   Breast mass 07/25/2016   palp lump left breast 4 o'clock left breast x 3 years-increases in size with periods   Dog bite of right hand 11/24/2018   GERD (gastroesophageal reflux disease)    Thoracic back pain 05/12/2018    Patient Active Problem List   Diagnosis Date Noted   Menorrhagia with regular cycle 04/03/2020   GERD (gastroesophageal reflux disease) 05/12/2018   Arthralgia 01/29/2018   Fatigue 06/18/2017   Fibroadenoma of left breast 12/25/2016   Preventative health care 07/16/2016   Breast mass 07/16/2016    Past Surgical History:  Procedure Laterality Date   CESAREAN SECTION     x2   ESOPHAGOGASTRODUODENOSCOPY N/A 07/24/2020   Procedure: ESOPHAGOGASTRODUODENOSCOPY (EGD);  Surgeon: Lin Landsman, MD;  Location: [facility] ENDOSCOPY;  Service: Gastroenterology;  Laterality: N/A;    OB History     Gravida  2   Para  2   Term      Preterm      AB      Living         SAB      IAB      Ectopic      Multiple      Live Births           Obstetric Comments  1st Menstrual Cycle: 13   1st Pregnancy:  28           Home Medications    Prior to Admission medications   Medication Sig Start Date End Date Taking? Authorizing Provider  Ascorbic Acid (VITAMIN C) 100 MG tablet Take 100 mg by mouth daily.     [provider]  b complex vitamins capsule Take 1 capsule by mouth daily.    [provider]  CALCIUM-MAGNESIUM-ZINC PO Take by mouth daily.    [provider]  etonogestrel-ethinyl estradiol (NUVARING) 0.12-0.015 MG/24HR vaginal ring USE VAGINALLY FOR THREE WEEKS THEN ONE WEEK OFF 06/01/20   [provider]  famotidine (PEPCID) 20 MG tablet Take 1 tablet (20 mg total) by mouth at bedtime. For heartburn 11/17/19   [name], NP  ferrous sulfate 325 (65 FE) MG EC tablet Take 325 mg by mouth 3 (three) times daily with meals.    [provider]  FOLIC ACID PO Take by mouth.    [provider]  Multiple Vitamin (MULTIVITAMIN) tablet Take 1 tablet by mouth daily.    [provider]  predniSONE (STERAPRED UNI-PAK 21 TAB) 10 MG (21) TBPK tablet Take by mouth daily. As directed 09/09/21   Sharion Balloon, NP    Family History Family History  Problem Relation Age of Onset   Diabetes Father    Heart attack Maternal Grandmother    Breast cancer Paternal Aunt  7   Leukemia Maternal Uncle    Healthy Mother    Prostate cancer Paternal [place]' disease Sister     Social History Social History   Tobacco Use   Smoking status: Never   Smokeless tobacco: Never  Vaping Use   Vaping Use: Never used  Substance Use Topics   Alcohol use: Yes    Comment: socially   Drug use: No     Allergies   Avocado, Other, and Oxycodone   Review of Systems Review of Systems  Constitutional: Negative.   Respiratory: Negative.    Cardiovascular: Negative.   Skin:  Positive for wound. Negative for color change, pallor and rash.  Neurological: Negative.     Physical Exam Triage Vital Signs ED Triage Vitals [10/08/21 1529]  Enc Vitals Group     BP 115/76     Pulse Rate 79     Resp 18     Temp 98.5 F (36.9 C)     Temp Source Oral     SpO2 99 %     Weight      Height      Head Circumference      Peak Flow      Pain Score      Pain  Loc      Pain Edu?      Excl. in [place]?    No data found.  Updated Vital Signs BP 115/76 (BP Location: Left Arm)   Pulse 79   Temp 98.5 F (36.9 C) (Oral)   Resp 18   LMP 10/01/2021   SpO2 99%   Visual Acuity Right Eye Distance:   Left Eye Distance:   Bilateral Distance:    Right Eye Near:   Left Eye Near:    Bilateral Near:     Physical Exam Constitutional:      Appearance: Normal appearance.  HENT:     Head: Normocephalic.  Eyes:     Extraocular Movements: Extraocular movements intact.  Pulmonary:     Effort: Pulmonary effort is normal.  Musculoskeletal:     Comments: Puncture wound present to the lateral aspect of the left index finger at the proximal phalanx and puncture wound present to the base of the second metacarpal, moderate to severe swelling present over the base of the second metacarpal with tenderness, bleeding has resolved, unable to flex left index finger, sensation is intact, capillary refill less than 3, 2+ radial pulse  Neurological:     Mental Status: She is alert and oriented to person, place, and time. Mental status is at baseline.  Psychiatric:        Mood and Affect: Mood normal.        Behavior: Behavior normal.     UC Treatments / Results  Labs (all labs ordered are listed, but only abnormal results are displayed) Labs Reviewed - No data to display  EKG   Radiology No results found.  Procedures Procedures (including critical care time)  Medications Ordered in UC Medications - No data to display  Initial Impression / Assessment and Plan / UC Course  I have reviewed the triage vital signs and the nursing notes.  Pertinent labs & imaging results that were available during my care of the patient were reviewed by me and considered in my medical decision making (see chart for details).  Puncture wound of left hand without foreign body, initial encounter Cat bite, initial encounter  X-ray of hand negative, able to notate soft tissue  swelling, discussed with patient, recommended continued use of ice for the next 24 hours then may use heat as well as ibuprofen to help manage swelling, Augmentin 7-day course prescribed prophylactically to prevent infection, discussed with patient that she needs to confirm rabies vaccination and that if not up-to-date she will need to go to the nearest emergency department to begin vaccine series, verbalized understanding, work note given, given walking referral to orthopedics if limited range of motion persist past resolution of swelling Final Clinical Impressions(s) / UC Diagnoses   Final diagnoses:  Cat bite, initial encounter   Discharge Instructions   None    ED Prescriptions   None    PDMP not reviewed this encounter.   Hans Eden, NP 10/08/21 1635

## 2021-10-08 NOTE — Telephone Encounter (Signed)
I spoke with pt and pt was bitten by one of her cats x 2 on lt index finger at 12 noon today. Pt is waiting on her husband to come home and she is going to [facility]; no available appts at [facility] this afternoon.pt said the lt index finger is swollen also. The cat is vaccinated and pt did wash the area well. Sending note to Gentry Fitz NP and [facility] CMA.

## 2021-10-08 NOTE — Discharge Instructions (Signed)
Your x-ray was negative for injury to the bone but they are able to notate swelling to your tissues  This swelling is most likely limiting your movement and should get better with time  Take Augmentin twice daily for the next 7 days to prevent infection  You may continue to ice the area in 10 to 15-minute intervals over the next 24 hours then you may use heat in addition  If swelling resolves and you continue to have limited movement of your finger please follow-up with orthopedics for further evaluation, information is listed below  Please confirm rabies vaccination for your cat, if your cat is not up-to-date on rabies vaccines you will need to start getting vaccinations prophylactically, you may check with your primary doctor to see if they have the vaccine available, if not you will need to start the series at the nearest emergency department then may get second and third dose at an urgent care

## 2021-10-08 NOTE — Telephone Encounter (Signed)
[facility] [facility] - Client TELEPHONE ADVICE RECORD [facility] Patient Name: Heather Park Gender: Female DOB: March 10, 1981 Age: 41 Y 83 M 11 D Return Phone Number: [phone] (Primary) Address: City/ State/ Zip: [place] [address] Client Heather Park - Client Client Site [name] [facility] Provider Alma Friendly - NP Contact Type Call Who Is Calling Patient / Member / Family / Caregiver Call Type Triage / Clinical Caller Name Heather Park Relationship To Patient Other Return Phone Number [phone] (Primary) Chief Complaint Animal Bite Reason for Call Symptomatic / Request for [name] states has a pt on the phone that was just bit by a cat and she would like to have her triaged. Translation No Nurse Assessment Nurse: Susy Manor, RN, Megan Date/Time (Eastern Time): 10/08/2021 12:48:09 PM Confirm and document reason for call. If symptomatic, describe symptoms. ---Caller states she was bit by her own cat twice. It drew blood/deep. Caller states she washed the wounds. Cat is vaccinated. Does the patient have any new or worsening symptoms? ---Yes Will a triage be completed? ---Yes Related visit to physician within the last 2 weeks? ---No Does the PT have any chronic conditions? (i.e. diabetes, asthma, this includes High risk factors for pregnancy, etc.) ---No Is the patient pregnant or possibly pregnant? (Ask all females between the ages of 80-55) ---No Is this a behavioral health or substance abuse call? ---No Guidelines Guideline Title Affirmed Question Affirmed Notes Nurse Date/Time (Eastern Time) Animal Bite [1] Puncture wound (hole through the skin) AND [2] from a cat bite (or deep claw puncture wound) Susy Manor, RN, Megan 10/08/2021 12:49:39 PM Disp. Time Eilene Ghazi Time) Disposition Final User PLEASE NOTE: All timestamps contained within this report are  represented as [place] Standard Time. CONFIDENTIALTY NOTICE: This fax transmission is intended only for the addressee. It contains information that is legally privileged, confidential or otherwise protected from use or disclosure. If you are not the intended recipient, you are strictly prohibited from reviewing, disclosing, copying using or disseminating any of this information or taking any action in reliance on or regarding this information. If you have received this fax in error, please notify us immediately by telephone so that we can arrange for its return to [place]. Phone: [phone], Toll-Free: [phone], Fax: [phone] Page: 2 of 2 Call Id: [id number] 10/08/2021 12:51:23 PM See HCP within 4 Hours (or PCP triage) Yes Susy Manor, RN, Jinny Blossom Caller Disagree/Comply Comply Caller Understands Yes PreDisposition Call Doctor Care Advice Given Per Guideline SEE HCP (OR PCP TRIAGE) WITHIN 4 HOURS: CLEAN THE WOUND: * Wash all bites and scratches right away with soap and water for 5 minutes. * Then rinse the area with water for 2 to 3 minutes. COVER THE WOUND: * Cover the wound with a dressing. CALL BACK IF: * You become worse CARE ADVICE given per Animal Bite (Adult) guideline. Comments User: Sula Soda, RN Date/Time Eilene Ghazi Time): 10/08/2021 12:55:49 PM no answer on back line when I tried to call for a warm transfer. Referrals REFERRED TO PCP OFFIC

## 2021-10-09 NOTE — Telephone Encounter (Signed)
Noted. Reviewed urgent care notes from yesterday.

## 2022-06-12 ENCOUNTER — Telehealth: Payer: Self-pay | Admitting: Primary Care

## 2022-06-12 NOTE — Telephone Encounter (Signed)
Patient would like to know if she can have orders placed to have her lipid profile drawn again,the last time one of the numbers was high,and she has been feeling very fatigued lately?

## 2022-06-13 NOTE — Telephone Encounter (Signed)
Unable to reach patient. Left voicemail to return call to our office.   

## 2022-06-13 NOTE — Telephone Encounter (Signed)
Her cholesterol level wasn't bad and would not cause her fatigue. I'm happy to see her in the office for her fatigue symptoms. We can draw some additional labs if warranted but it may be best for [place] to meet.

## 2022-06-14 NOTE — Telephone Encounter (Signed)
Unable to reach patient. Left voicemail to return call to our office.   

## 2022-06-18 NOTE — Telephone Encounter (Signed)
Called and advised patient of Clearence Cheek message. Scheduled patient appointment for 06/26/22.

## 2022-06-26 ENCOUNTER — Ambulatory Visit: Payer: Commercial Managed Care - PPO | Admitting: Primary Care

## 2022-06-26 ENCOUNTER — Encounter: Payer: Self-pay | Admitting: Primary Care

## 2022-07-09 ENCOUNTER — Ambulatory Visit: Payer: Commercial Managed Care - PPO | Admitting: Primary Care

## 2022-07-30 ENCOUNTER — Ambulatory Visit: Payer: Commercial Managed Care - PPO | Admitting: Primary Care

## 2022-08-08 ENCOUNTER — Encounter: Payer: Self-pay | Admitting: Primary Care

## 2022-08-08 ENCOUNTER — Ambulatory Visit (INDEPENDENT_AMBULATORY_CARE_PROVIDER_SITE_OTHER): Payer: Commercial Managed Care - PPO | Admitting: Primary Care

## 2022-08-08 VITALS — BP 104/72 | HR 75 | Temp 98.0°F | Ht 60.0 in | Wt 126.0 lb

## 2022-08-08 DIAGNOSIS — Z832 Family history of diseases of the blood and blood-forming organs and certain disorders involving the immune mechanism: Secondary | ICD-10-CM | POA: Diagnosis not present

## 2022-08-08 DIAGNOSIS — R5383 Other fatigue: Secondary | ICD-10-CM | POA: Diagnosis not present

## 2022-08-08 LAB — IBC + FERRITIN
Ferritin: 31 ng/mL (ref 10.0–291.0)
Iron: 228 ug/dL — ABNORMAL HIGH (ref 42–145)
Saturation Ratios: 58.8 % — ABNORMAL HIGH (ref 20.0–50.0)
TIBC: 387.8 ug/dL (ref 250.0–450.0)
Transferrin: 277 mg/dL (ref 212.0–360.0)

## 2022-08-08 LAB — VITAMIN D 25 HYDROXY (VIT D DEFICIENCY, FRACTURES): VITD: 53.79 ng/mL (ref 30.00–100.00)

## 2022-08-08 LAB — VITAMIN B12: Vitamin B-12: 219 pg/mL (ref 211–911)

## 2022-08-08 NOTE — Addendum Note (Signed)
Addended by: Ellamae Sia on: 08/08/2022 11:17 AM   Modules accepted: Orders

## 2022-08-08 NOTE — Assessment & Plan Note (Addendum)
Chronic and continued. No alarm signs. She doesn't seem to have sleep apnea.  Reviewed labs from May 2023. Anxiety/depression is not a concern.  Recheck CBC, iron studies. Add vitamin D and B12. Referral placed to hematology given family history.

## 2022-08-08 NOTE — Progress Notes (Signed)
Subjective:    Patient ID: Heather Park, female    DOB: October 08, 1980, 42 y.o.   MRN: [id number]  HPI  Heather Park is a very pleasant 42 y.o. female with a history of fatigue, menorrhagia with regular cycle, vitamin D deficiency, anemia, GERD who presents today to discuss fatigue.  Chronic history of fatigue. She was last evaluated for fatigue in May 2023. During this visit she mentioned increased stress due to her workload. Labs were checked to rule out metabolic cause, all labs unremarkable (iron studies, thyroid, CBC, CMP).   Today she mentions increased fatigue over the last several months. Her fatigue is worse when she stops taking her B complex vitamin.   Her sister was recently diagnosed with a blood disorder, blood dyscrasia. She believes that she was diagnosed with blood dyscrasia years ago when living in [place], she does not have these records, but she didn't believe the diagnosis at the time.   She is sleeping about 6-8 hours per night, uninterrupted. 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 She denies menorrhagia, rectal bleeding, increased anxiety/stress. No recent consumption of oral iron.    Review of Systems  Constitutional:  Positive for fatigue.  Respiratory:  Negative for shortness of breath.   Cardiovascular:  Negative for palpitations.  Gastrointestinal:  Negative for rectal pain.  Genitourinary:  Negative for menstrual problem.  Neurological:  Negative for dizziness and headaches.         Past Medical History:  Diagnosis Date   Breast mass 07/25/2016   palp lump left breast 4 o'clock left breast x 3 years-increases in size with periods   Dog bite of right hand 11/24/2018   GERD (gastroesophageal reflux disease)    Thoracic back pain 05/12/2018    Social History   Socioeconomic History   Marital status: Married    Spouse name: Not on file   Number of children: Not on file   Years of education: Not on file   Highest education  level: Not on file  Occupational History   Not on file  Tobacco Use   Smoking status: Never   Smokeless tobacco: Never  Vaping Use   Vaping Use: Never used  Substance and Sexual Activity   Alcohol use: Yes    Comment: socially   Drug use: No   Sexual activity: Not on file  Other Topics Concern   Not on file  Social History Narrative   Married.   2 children.   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    Social Determinants of Health   Financial Resource Strain: Not on file  Food Insecurity: Not on file  Transportation Needs: Not on file  Physical Activity: Not on file  Stress: Not on file  Social Connections: Not on file  Intimate Partner Violence: Not on file    Past Surgical History:  Procedure Laterality Date   CESAREAN SECTION     x2   ESOPHAGOGASTRODUODENOSCOPY N/A 07/24/2020   Procedure: ESOPHAGOGASTRODUODENOSCOPY (EGD);  Surgeon: Lin Landsman, MD;  Location: [facility] ENDOSCOPY;  Service: Gastroenterology;  Laterality: N/A;    Family History  Problem Relation Age of Onset   Diabetes Father    Heart attack Maternal Grandmother    Breast cancer Paternal Aunt 40   Leukemia Maternal Uncle    Healthy Mother    Prostate cancer Paternal Vista Deck' disease Sister     Allergies  Allergen Reactions  Avocado Itching   Other    Oxycodone Itching    Current Outpatient Medications on File Prior to Visit  Medication Sig Dispense Refill   b complex vitamins capsule Take 1 capsule by mouth daily.     CALCIUM-MAGNESIUM-ZINC PO Take by mouth daily.     etonogestrel-ethinyl estradiol (NUVARING) 0.12-0.015 MG/24HR vaginal ring USE VAGINALLY FOR THREE WEEKS THEN ONE WEEK OFF     famotidine (PEPCID) 20 MG tablet Take 1 tablet (20 mg total) by mouth at bedtime. For heartburn 90 tablet 1   Multiple Vitamin (MULTIVITAMIN) tablet Take 1 tablet by mouth daily.     FOLIC ACID PO Take by mouth. (Patient not taking: Reported on 08/08/2022)      predniSONE (STERAPRED UNI-PAK 21 TAB) 10 MG (21) TBPK tablet Take by mouth daily. As directed 21 tablet 0   No current facility-administered medications on file prior to visit.    BP 104/72   Pulse 75   Temp 98 F (36.7 C) (Temporal)   Ht 5' (1.524 m)   Wt 126 lb (57.2 kg)   LMP 07/20/2022 (Exact Date)   SpO2 99%   BMI 24.61 kg/m  Objective:   Physical Exam Cardiovascular:     Rate and Rhythm: Normal rate and regular rhythm.  Pulmonary:     Effort: Pulmonary effort is normal.     Breath sounds: Normal breath sounds.  Musculoskeletal:     Cervical back: Neck supple.  Skin:    General: Skin is warm and dry.           Assessment & Plan:  Fatigue, unspecified type Assessment & Plan: Chronic and continued. No alarm signs. She doesn't seem to have sleep apnea.  Reviewed labs from May 2023. Anxiety/depression is not a concern.  Recheck CBC, iron studies. Add vitamin D and B12. Referral placed to hematology given family history.   Orders: -     CBC with Differential/Platelet -     VITAMIN D 25 Hydroxy (Vit-D Deficiency, Fractures) -     Vitamin B12 -     IBC + Ferritin -     Ambulatory referral to Hematology / Oncology  Family history of blood dyscrasia Assessment & Plan: In sister, new diagnosis.  Checking labs today. Referral placed for hematology.  Orders: -     CBC with Differential/Platelet -     Pathologist smear review -     IBC + Ferritin -     Ambulatory referral to Hematology / Oncology        [name], NP

## 2022-08-08 NOTE — Patient Instructions (Signed)
Stop by the lab prior to leaving today. I will notify you of your results once received.   You will either be contacted via phone regarding your referral to hematology, or you may receive a letter on your MyChart portal from our referral team with instructions for scheduling an appointment. Please let us know if you have not been contacted by anyone within two weeks.  It was a pleasure to see you today!

## 2022-08-08 NOTE — Assessment & Plan Note (Signed)
In sister, new diagnosis.  Checking labs today. Referral placed for hematology.

## 2022-08-09 LAB — CBC WITH DIFFERENTIAL/PLATELET
Absolute Monocytes: 348 cells/uL (ref 200–950)
Basophils Absolute: 40 cells/uL (ref 0–200)
Basophils Relative: 0.7 %
Eosinophils Absolute: 91 cells/uL (ref 15–500)
Eosinophils Relative: 1.6 %
HCT: 39.5 % (ref 35.0–45.0)
Hemoglobin: 12.9 g/dL (ref 11.7–15.5)
Lymphs Abs: 2194.5 cells/uL (ref 850–3900)
MCH: 26.3 pg — ABNORMAL LOW (ref 27.0–33.0)
MCHC: 32.7 g/dL (ref 32.0–36.0)
MCV: 80.4 fL (ref 80.0–100.0)
MPV: 11.8 fL (ref 7.5–12.5)
Monocytes Relative: 6.1 %
Neutro Abs: 3027 cells/uL (ref 1500–7800)
Neutrophils Relative %: 53.1 %
Platelets: 188 10*3/uL (ref 140–400)
RBC: 4.91 10*6/uL (ref 3.80–5.10)
RDW: 13.2 % (ref 11.0–15.0)
Total Lymphocyte: 38.5 %
WBC: 5.7 10*3/uL (ref 3.8–10.8)

## 2022-08-09 LAB — PATHOLOGIST SMEAR REVIEW

## 2022-08-14 ENCOUNTER — Inpatient Hospital Stay: Payer: Commercial Managed Care - PPO | Attending: Oncology | Admitting: Oncology

## 2022-08-14 ENCOUNTER — Inpatient Hospital Stay: Payer: Commercial Managed Care - PPO

## 2022-08-14 ENCOUNTER — Encounter: Payer: Self-pay | Admitting: Oncology

## 2022-08-14 VITALS — BP 115/73 | HR 71 | Temp 97.7°F | Ht 60.0 in | Wt 128.0 lb

## 2022-08-14 DIAGNOSIS — R7989 Other specified abnormal findings of blood chemistry: Secondary | ICD-10-CM | POA: Diagnosis not present

## 2022-08-14 DIAGNOSIS — R5383 Other fatigue: Secondary | ICD-10-CM | POA: Diagnosis not present

## 2022-08-14 DIAGNOSIS — Z832 Family history of diseases of the blood and blood-forming organs and certain disorders involving the immune mechanism: Secondary | ICD-10-CM | POA: Insufficient documentation

## 2022-08-14 LAB — CBC WITH DIFFERENTIAL/PLATELET
Abs Immature Granulocytes: 0.02 10*3/uL (ref 0.00–0.07)
Basophils Absolute: 0.1 10*3/uL (ref 0.0–0.1)
Basophils Relative: 1 %
Eosinophils Absolute: 0.1 10*3/uL (ref 0.0–0.5)
Eosinophils Relative: 2 %
HCT: 37.2 % (ref 36.0–46.0)
Hemoglobin: 12.5 g/dL (ref 12.0–15.0)
Immature Granulocytes: 0 %
Lymphocytes Relative: 36 %
Lymphs Abs: 2.1 10*3/uL (ref 0.7–4.0)
MCH: 26.9 pg (ref 26.0–34.0)
MCHC: 33.6 g/dL (ref 30.0–36.0)
MCV: 80 fL (ref 80.0–100.0)
Monocytes Absolute: 0.5 10*3/uL (ref 0.1–1.0)
Monocytes Relative: 8 %
Neutro Abs: 3.1 10*3/uL (ref 1.7–7.7)
Neutrophils Relative %: 53 %
Platelets: 172 10*3/uL (ref 150–400)
RBC: 4.65 MIL/uL (ref 3.87–5.11)
RDW: 12.4 % (ref 11.5–15.5)
WBC: 5.9 10*3/uL (ref 4.0–10.5)
nRBC: 0 % (ref 0.0–0.2)

## 2022-08-14 LAB — FOLATE: Folate: 36 ng/mL (ref >5.9)

## 2022-08-14 NOTE — Progress Notes (Signed)
[facility]  Telephone:(336) [phone] Fax:(336) [phone]  ID: Heather Park OB: [birth year]  MR#: [id number]  SEG#:[id number]  Patient Care Team: Doreene Nest, NP as PCP - General (Internal Medicine) Doreene Nest, NP as Nurse Practitioner (Internal Medicine) Lemar Livings, Merrily Pew, MD (General Surgery) Obgyn, Ma Hillock as Consulting Physician (Gynecology)  CHIEF COMPLAINT: Fatigue, possible family history of "hemoglobinopathy"  INTERVAL HISTORY: Patient is a 42 year old female who complains of recent onset of significant fatigue.  She also reports her sister has been recently diagnosed with a "hemoglobinopathy", but unclear which one.  She otherwise feels well and is asymptomatic.  She has no neurologic complaints.  She denies any recent fevers or illnesses.  She has a good appetite and denies weight loss.  She has no chest pain, shortness of breath, cough, or hemoptysis.  She denies any nausea, vomiting, constipation, diarrhea.  She has no urinary complaints.  Patient otherwise feels well and offers no further specific complaints today.  REVIEW OF SYSTEMS:   Review of Systems  Constitutional:  Positive for malaise/fatigue. Negative for fever and weight loss.  Respiratory: Negative.  Negative for cough, hemoptysis and shortness of breath.   Cardiovascular: Negative.  Negative for chest pain and leg swelling.  Gastrointestinal: Negative.  Negative for abdominal pain.  Genitourinary: Negative.  Negative for dysuria.  Musculoskeletal: Negative.  Negative for back pain.  Skin: Negative.  Negative for rash.  Neurological: Negative.  Negative for dizziness, focal weakness, weakness and headaches.  Psychiatric/Behavioral: Negative.  The patient is not nervous/anxious.     As per HPI. Otherwise, a complete review of systems is negative.  PAST MEDICAL HISTORY: Past Medical History:  Diagnosis Date   Breast mass 07/25/2016   palp lump left breast 4 o'clock left  breast x 3 years-increases in size with periods   Dog bite of right hand 11/24/2018   GERD (gastroesophageal reflux disease)    Thoracic back pain 05/12/2018    PAST SURGICAL HISTORY: Past Surgical History:  Procedure Laterality Date   CESAREAN SECTION     x2   CESAREAN SECTION     ESOPHAGOGASTRODUODENOSCOPY N/A 07/24/2020   Procedure: ESOPHAGOGASTRODUODENOSCOPY (EGD);  Surgeon: Toney Reil, MD;  Location: [facility] ENDOSCOPY;  Service: Gastroenterology;  Laterality: N/A;    FAMILY HISTORY: Family History  Problem Relation Age of Onset   Diabetes Father    Heart attack Maternal Grandmother    Breast cancer Paternal Aunt 70   Leukemia Maternal Uncle    Healthy Mother    Prostate cancer Paternal Colan Neptune' disease Sister     ADVANCED DIRECTIVES (Y/N):  N  HEALTH MAINTENANCE: Social History   Tobacco Use   Smoking status: Never   Smokeless tobacco: Never  Vaping Use   Vaping Use: Never used  Substance Use Topics   Alcohol use: Yes    Alcohol/week: 2.0 standard drinks of alcohol    Types: 2 Glasses of wine per week    Comment: socially   Drug use: No     Colonoscopy:  PAP:  Bone density:  Lipid panel:  Allergies  Allergen Reactions   Avocado Itching   Other    Oxycodone Itching    Current Outpatient Medications  Medication Sig Dispense Refill   b complex vitamins capsule Take 1 capsule by mouth daily.     CALCIUM-MAGNESIUM-ZINC PO Take by mouth daily.     etonogestrel-ethinyl estradiol (NUVARING) 0.12-0.015 MG/24HR vaginal ring USE VAGINALLY FOR THREE WEEKS THEN ONE WEEK OFF  Multiple Vitamin (MULTIVITAMIN) tablet Take 1 tablet by mouth daily.     famotidine (PEPCID) 20 MG tablet Take 1 tablet (20 mg total) by mouth at bedtime. For heartburn (Patient not taking: Reported on 08/14/2022) 90 tablet 1   FOLIC ACID PO Take by mouth. (Patient not taking: Reported on 08/08/2022)     predniSONE (STERAPRED UNI-PAK 21 TAB) 10 MG (21) TBPK tablet Take by mouth  daily. As directed (Patient not taking: Reported on 08/14/2022) 21 tablet 0   No current facility-administered medications for this visit.    OBJECTIVE: Vitals:   08/14/22 1111  BP: 115/73  Pulse: 71  Temp: 97.7 F (36.5 C)  SpO2: 100%     Body mass index is 25 kg/m.    ECOG FS:0 - Asymptomatic  General: Well-developed, well-nourished, no acute distress. Eyes: Pink conjunctiva, anicteric sclera. HEENT: Normocephalic, moist mucous membranes. Lungs: No audible wheezing or coughing. Heart: Regular rate and rhythm. Abdomen: Soft, nontender, no obvious distention. Musculoskeletal: No edema, cyanosis, or clubbing. Neuro: Alert, answering all questions appropriately. Cranial nerves grossly intact. Skin: No rashes or petechiae noted. Psych: Normal affect. Lymphatics: No cervical, calvicular, axillary or inguinal LAD.   LAB RESULTS:  Lab Results  Component Value Date   NA 138 09/05/2021   K 4.4 09/05/2021   CL 104 09/05/2021   CO2 29 09/05/2021   GLUCOSE 96 09/05/2021   BUN 12 09/05/2021   CREATININE 0.80 09/05/2021   CALCIUM 9.1 09/05/2021   PROT 7.3 09/05/2021   ALBUMIN 4.2 09/05/2021   AST 20 09/05/2021   ALT 18 09/05/2021   ALKPHOS 40 09/05/2021   BILITOT 0.5 09/05/2021    Lab Results  Component Value Date   WBC 5.9 08/14/2022   NEUTROABS 3.1 08/14/2022   HGB 12.5 08/14/2022   HCT 37.2 08/14/2022   MCV 80.0 08/14/2022   PLT 172 08/14/2022   Lab Results  Component Value Date   IRON 228 (H) 08/08/2022   TIBC 387.8 08/08/2022   IRONPCTSAT 58.8 (H) 08/08/2022   Lab Results  Component Value Date   FERRITIN 31.0 08/08/2022     STUDIES: No results found.  ASSESSMENT: Fatigue, possible family history of "hemoglobinopathy"  PLAN:    Fatigue: Patient CBC is completely within normal limits.  Previously iron stores were mildly elevated and patient had a normal B12 level.  Folate levels are within normal limits.  TSH and thyroid panel are pending at time of  dictation.  Patient will have video-assisted telemedicine visit in 3 weeks to discuss the results. Elevated iron saturation ratio: Unrelated to fatigue, but have ordered hemochromatosis panel for completeness. Family history of hemoglobinopathy: Have ordered hemoglobin electrophoresis for completeness.  I spent a total of 45 minutes reviewing chart data, face-to-face evaluation with the patient, counseling and coordination of care as detailed above.  Patient expressed understanding and was in agreement with this plan. She also understands that She can call clinic at any time with any questions, concerns, or complaints.    Jeralyn Ruths, MD   08/14/2022 3:08 PM

## 2022-08-15 LAB — THYROID PANEL WITH TSH
Free Thyroxine Index: 2.9 (ref 1.2–4.9)
T3 Uptake Ratio: 24 % (ref 24–39)
T4, Total: 12 ug/dL (ref 4.5–12.0)
TSH: 0.818 u[IU]/mL (ref 0.450–4.500)

## 2022-08-16 LAB — HGB FRACTIONATION BY HPLC
Hgb A: 67.7 % — ABNORMAL LOW (ref 96.4–98.8)
Hgb C: 0 %
Hgb E: 29 % — ABNORMAL HIGH
Hgb F: 0 % (ref 0.0–2.0)
Hgb S: 0 %
Hgb Variant: 0 %

## 2022-08-16 LAB — HGB FRACTIONATION CASCADE: Hgb A2: 3.3 % — ABNORMAL HIGH (ref 1.8–3.2)

## 2022-09-02 LAB — HEMOCHROMATOSIS DNA-PCR(C282Y,H63D)

## 2022-09-05 ENCOUNTER — Inpatient Hospital Stay: Payer: Commercial Managed Care - PPO | Attending: Oncology | Admitting: Oncology

## 2022-09-05 DIAGNOSIS — D582 Other hemoglobinopathies: Secondary | ICD-10-CM | POA: Diagnosis not present

## 2022-09-05 NOTE — Progress Notes (Signed)
[facility]  Telephone:(336) [phone] Fax:(336) [phone]  ID: Heather Park OB: 03-May-1981  MR#: [id number]  AOZ#:[id number]  Patient Care Team: Doreene Nest, NP as PCP - General (Internal Medicine) Doreene Nest, NP as Nurse Practitioner (Internal Medicine) Lemar Livings Merrily Pew, MD (General Surgery) Obgyn, Ma Hillock as Consulting Physician (Gynecology)  I connected with Heather Park on 09/05/22 at  3:00 PM EDT by video enabled telemedicine visit and verified that I am speaking with the correct person using two identifiers.   I discussed the limitations, risks, security and privacy concerns of performing an evaluation and management service by telemedicine and the availability of in-person appointments. I also discussed with the patient that there may be a patient responsible charge related to this service. The patient expressed understanding and agreed to proceed.   Other persons participating in the visit and their role in the encounter: Patient, MD.  Patient's location: Home. Provider's location: Clinic.   CHIEF COMPLAINT: Hemoglobin E trait.  INTERVAL HISTORY: Patient agreed to video assisted telemedicine visit for further evaluation and discussion of her laboratory results.  She continues to have fatigue, but otherwise feels well.  She has no neurologic complaints.  She denies any recent fevers or illnesses.  She has a good appetite and denies weight loss.  She has no chest pain, shortness of breath, cough, or hemoptysis.  She denies any nausea, vomiting, constipation, diarrhea.  She has no urinary complaints.  Patient offers no further specific complaints today.  REVIEW OF SYSTEMS:   Review of Systems  Constitutional:  Positive for malaise/fatigue. Negative for fever and weight loss.  Respiratory: Negative.  Negative for cough, hemoptysis and shortness of breath.   Cardiovascular: Negative.  Negative for chest pain and leg swelling.  Gastrointestinal:  Negative.  Negative for abdominal pain.  Genitourinary: Negative.  Negative for dysuria.  Musculoskeletal: Negative.  Negative for back pain.  Skin: Negative.  Negative for rash.  Neurological: Negative.  Negative for dizziness, focal weakness, weakness and headaches.  Psychiatric/Behavioral: Negative.  The patient is not nervous/anxious.     As per HPI. Otherwise, a complete review of systems is negative.  PAST MEDICAL HISTORY: Past Medical History:  Diagnosis Date   Breast mass 07/25/2016   palp lump left breast 4 o'clock left breast x 3 years-increases in size with periods   Dog bite of right hand 11/24/2018   GERD (gastroesophageal reflux disease)    Thoracic back pain 05/12/2018    PAST SURGICAL HISTORY: Past Surgical History:  Procedure Laterality Date   CESAREAN SECTION     x2   CESAREAN SECTION     ESOPHAGOGASTRODUODENOSCOPY N/A 07/24/2020   Procedure: ESOPHAGOGASTRODUODENOSCOPY (EGD);  Surgeon: Toney Reil, MD;  Location: [facility] ENDOSCOPY;  Service: Gastroenterology;  Laterality: N/A;    FAMILY HISTORY: Family History  Problem Relation Age of Onset   Diabetes Father    Heart attack Maternal Grandmother    Breast cancer Paternal Aunt 31   Leukemia Maternal Uncle    Healthy Mother    Prostate cancer Paternal Colan Neptune' disease Sister     ADVANCED DIRECTIVES (Y/N):  N  HEALTH MAINTENANCE: Social History   Tobacco Use   Smoking status: Never   Smokeless tobacco: Never  Vaping Use   Vaping Use: Never used  Substance Use Topics   Alcohol use: Yes    Alcohol/week: 2.0 standard drinks of alcohol    Types: 2 Glasses of wine per week    Comment: socially  Drug use: No     Colonoscopy:  PAP:  Bone density:  Lipid panel:  Allergies  Allergen Reactions   Avocado Itching   Other    Oxycodone Itching    Current Outpatient Medications  Medication Sig Dispense Refill   b complex vitamins capsule Take 1 capsule by mouth daily.      CALCIUM-MAGNESIUM-ZINC PO Take by mouth daily.     etonogestrel-ethinyl estradiol (NUVARING) 0.12-0.015 MG/24HR vaginal ring USE VAGINALLY FOR THREE WEEKS THEN ONE WEEK OFF     famotidine (PEPCID) 20 MG tablet Take 1 tablet (20 mg total) by mouth at bedtime. For heartburn (Patient not taking: Reported on 08/14/2022) 90 tablet 1   FOLIC ACID PO Take by mouth. (Patient not taking: Reported on 08/08/2022)     Multiple Vitamin (MULTIVITAMIN) tablet Take 1 tablet by mouth daily.     predniSONE (STERAPRED UNI-PAK 21 TAB) 10 MG (21) TBPK tablet Take by mouth daily. As directed (Patient not taking: Reported on 08/14/2022) 21 tablet 0   No current facility-administered medications for this visit.    OBJECTIVE: There were no vitals filed for this visit.    There is no height or weight on file to calculate BMI.    ECOG FS:0 - Asymptomatic  General: Well-developed, well-nourished, no acute distress. HEENT: Normocephalic. Neuro: Alert, answering all questions appropriately. Cranial nerves grossly intact. Psych: Normal affect.    LAB RESULTS:  Lab Results  Component Value Date   NA 138 09/05/2021   K 4.4 09/05/2021   CL 104 09/05/2021   CO2 29 09/05/2021   GLUCOSE 96 09/05/2021   BUN 12 09/05/2021   CREATININE 0.80 09/05/2021   CALCIUM 9.1 09/05/2021   PROT 7.3 09/05/2021   ALBUMIN 4.2 09/05/2021   AST 20 09/05/2021   ALT 18 09/05/2021   ALKPHOS 40 09/05/2021   BILITOT 0.5 09/05/2021    Lab Results  Component Value Date   WBC 5.9 08/14/2022   NEUTROABS 3.1 08/14/2022   HGB 12.5 08/14/2022   HCT 37.2 08/14/2022   MCV 80.0 08/14/2022   PLT 172 08/14/2022   Lab Results  Component Value Date   IRON 228 (H) 08/08/2022   TIBC 387.8 08/08/2022   IRONPCTSAT 58.8 (H) 08/08/2022   Lab Results  Component Value Date   FERRITIN 31.0 08/08/2022     STUDIES: No results found.  ASSESSMENT: Hemoglobin E trait.  PLAN:    Hemoglobin E trait: Clinically insignificant.  No further  intervention is needed. Elevated iron saturation ratio: Unrelated to fatigue.  Hemochromatosis mutation is negative.  Patient okay to donate blood if she wishes. Fatigue: Unclear etiology.  All of patient's laboratory work including thyroid panel are either negative or within normal limits. Disposition: No further follow-up is necessary.  I provided 20 minutes of face-to-face video visit time during this encounter which included chart review, counseling, and coordination of care as documented above.   Patient expressed understanding and was in agreement with this plan. She also understands that She can call clinic at any time with any questions, concerns, or complaints.    Jeralyn Ruths, MD   09/05/2022 4:30 PM

## 2022-09-10 ENCOUNTER — Ambulatory Visit
Admission: EM | Admit: 2022-09-10 | Discharge: 2022-09-10 | Disposition: A | Discharge status: Home / Self Care | Payer: Commercial Managed Care - PPO | Attending: Urgent Care | Admitting: Urgent Care

## 2022-09-10 ENCOUNTER — Ambulatory Visit: Payer: Commercial Managed Care - PPO | Admitting: Primary Care

## 2022-09-10 DIAGNOSIS — L237 Allergic contact dermatitis due to plants, except food: Secondary | ICD-10-CM | POA: Diagnosis not present

## 2022-09-10 MED ORDER — PREDNISONE 10 MG PO TABS
ORAL_TABLET | ORAL | 0 refills | Status: DC
Start: 2022-09-10 — End: 2023-06-20

## 2022-09-10 NOTE — ED Provider Notes (Signed)
Heather Park    CSN: [id number] Arrival date & time: 09/10/22  0949      History   Chief Complaint Chief Complaint  Patient presents with   Poison Ivy    HPI Heather Park is a 42 y.o. female.    Poison Ivy    Patient presents to urgent care with complaint of poison ivy rash on bilateral arms, chin.  Contact approximately 1 week ago.  Past Medical History:  Diagnosis Date   Breast mass 07/25/2016   palp lump left breast 4 o'clock left breast x 3 years-increases in size with periods   Dog bite of right hand 11/24/2018   GERD (gastroesophageal reflux disease)    Thoracic back pain 05/12/2018    Patient Active Problem List   Diagnosis Date Noted   Hemoglobin E trait (HCC) 09/05/2022   Family history of blood dyscrasia 08/08/2022   Menorrhagia with regular cycle 04/03/2020   GERD (gastroesophageal reflux disease) 05/12/2018   Arthralgia 01/29/2018   Fatigue 06/18/2017   Fibroadenoma of left breast 12/25/2016   Preventative health care 07/16/2016   Breast mass 07/16/2016    Past Surgical History:  Procedure Laterality Date   CESAREAN SECTION     x2   CESAREAN SECTION     ESOPHAGOGASTRODUODENOSCOPY N/A 07/24/2020   Procedure: ESOPHAGOGASTRODUODENOSCOPY (EGD);  Surgeon: Toney Reil, MD;  Location: [facility] ENDOSCOPY;  Service: Gastroenterology;  Laterality: N/A;    OB History     Gravida  2   Para  2   Term      Preterm      AB      Living         SAB      IAB      Ectopic      Multiple      Live Births           Obstetric Comments  1st Menstrual Cycle: 13   1st Pregnancy:  28           Home Medications    Prior to Admission medications   Medication Sig Start Date End Date Taking? Authorizing Provider  b complex vitamins capsule Take 1 capsule by mouth daily.    [provider]  CALCIUM-MAGNESIUM-ZINC PO Take by mouth daily.    [provider]  etonogestrel-ethinyl estradiol (NUVARING)  0.12-0.015 MG/24HR vaginal ring USE VAGINALLY FOR THREE WEEKS THEN ONE WEEK OFF 06/01/20   [provider]  famotidine (PEPCID) 20 MG tablet Take 1 tablet (20 mg total) by mouth at bedtime. For heartburn Patient not taking: Reported on 08/14/2022 11/17/19   Doreene Nest, NP  FOLIC ACID PO Take by mouth. Patient not taking: Reported on 08/08/2022    [provider]  Multiple Vitamin (MULTIVITAMIN) tablet Take 1 tablet by mouth daily.    [provider]  predniSONE (STERAPRED UNI-PAK 21 TAB) 10 MG (21) TBPK tablet Take by mouth daily. As directed Patient not taking: Reported on 08/14/2022 09/09/21   Mickie Bail, NP    Family History Family History  Problem Relation Age of Onset   Diabetes Father    Heart attack Maternal Grandmother    Breast cancer Paternal Aunt 88   Leukemia Maternal Uncle    Healthy Mother    Prostate cancer Paternal Colan Neptune' disease Sister     Social History Social History   Tobacco Use   Smoking status: Never   Smokeless tobacco: Never  Vaping  Use   Vaping Use: Never used  Substance Use Topics   Alcohol use: Yes    Alcohol/week: 2.0 standard drinks of alcohol    Types: 2 Glasses of wine per week    Comment: socially   Drug use: No     Allergies   Avocado, Other, and Oxycodone   Review of Systems Review of Systems   Physical Exam Triage Vital Signs ED Triage Vitals  Enc Vitals Group     BP      Pulse      Resp      Temp      Temp src      SpO2      Weight      Height      Head Circumference      Peak Flow      Pain Score      Pain Loc      Pain Edu?      Excl. in GC?    No data found.  Updated Vital Signs There were no vitals taken for this visit.  Visual Acuity Right Eye Distance:   Left Eye Distance:   Bilateral Distance:    Right Eye Near:   Left Eye Near:    Bilateral Near:     Physical Exam Vitals reviewed.  Constitutional:      Appearance: Normal appearance.  Skin:     General: Skin is warm and dry.     Findings: Erythema and rash present.          Comments: Blistering rash on an erythematous base.  Bilateral arms at the [facility] and chin.  Neurological:     General: No focal deficit present.     Mental Status: She is alert and oriented to person, place, and time.  Psychiatric:        Mood and Affect: Mood normal.        Behavior: Behavior normal.      UC Treatments / Results  Labs (all labs ordered are listed, but only abnormal results are displayed) Labs Reviewed - No data to display  EKG   Radiology No results found.  Procedures Procedures (including critical care time)  Medications Ordered in UC Medications - No data to display  Initial Impression / Assessment and Plan / UC Course  I have reviewed the triage vital signs and the nursing notes.  Pertinent labs & imaging results that were available during my care of the patient were reviewed by me and considered in my medical decision making (see chart for details).   Heather Park is a 42 y.o. female presenting with contact dermatitis. Patient is afebrile without recent antipyretics, satting well on room air. Overall is well appearing though non-toxic, well hydrated, without respiratory distress.  Erythematous rash on her bilateral arms near the [facility] and her chin.  Some blistering is present.  Minor to moderate contact dermatitis due to poison ivy.  Patient requests treatment with oral prednisone.  Counseled patient on potential for adverse effects with medications prescribed/recommended today, ER and return-to-clinic precautions discussed, patient verbalized understanding and agreement with care plan.   Final Clinical Impressions(s) / UC Diagnoses   Final diagnoses:  None   Discharge Instructions   None    ED Prescriptions   None    PDMP not reviewed this encounter.   Charma Igo, [place] 09/10/22 1035

## 2022-09-10 NOTE — Discharge Instructions (Addendum)
Follow up here or with your primary care provider if your symptoms are worsening or not improving.     

## 2022-09-10 NOTE — ED Triage Notes (Signed)
Patient presents to UC for poison ivy rash on left and right arm, chin area x 1 week ago. Not using any OTC meds.

## 2022-12-06 IMAGING — DX DG HAND COMPLETE 3+V*L*
3 series · 3 of 3 positions shown · non-contrast
Comparison: None Available.

CLINICAL DATA: Cat bite today.  Pain and swelling.

EXAM:
LEFT HAND - COMPLETE 3+ VIEW

[hand pa]
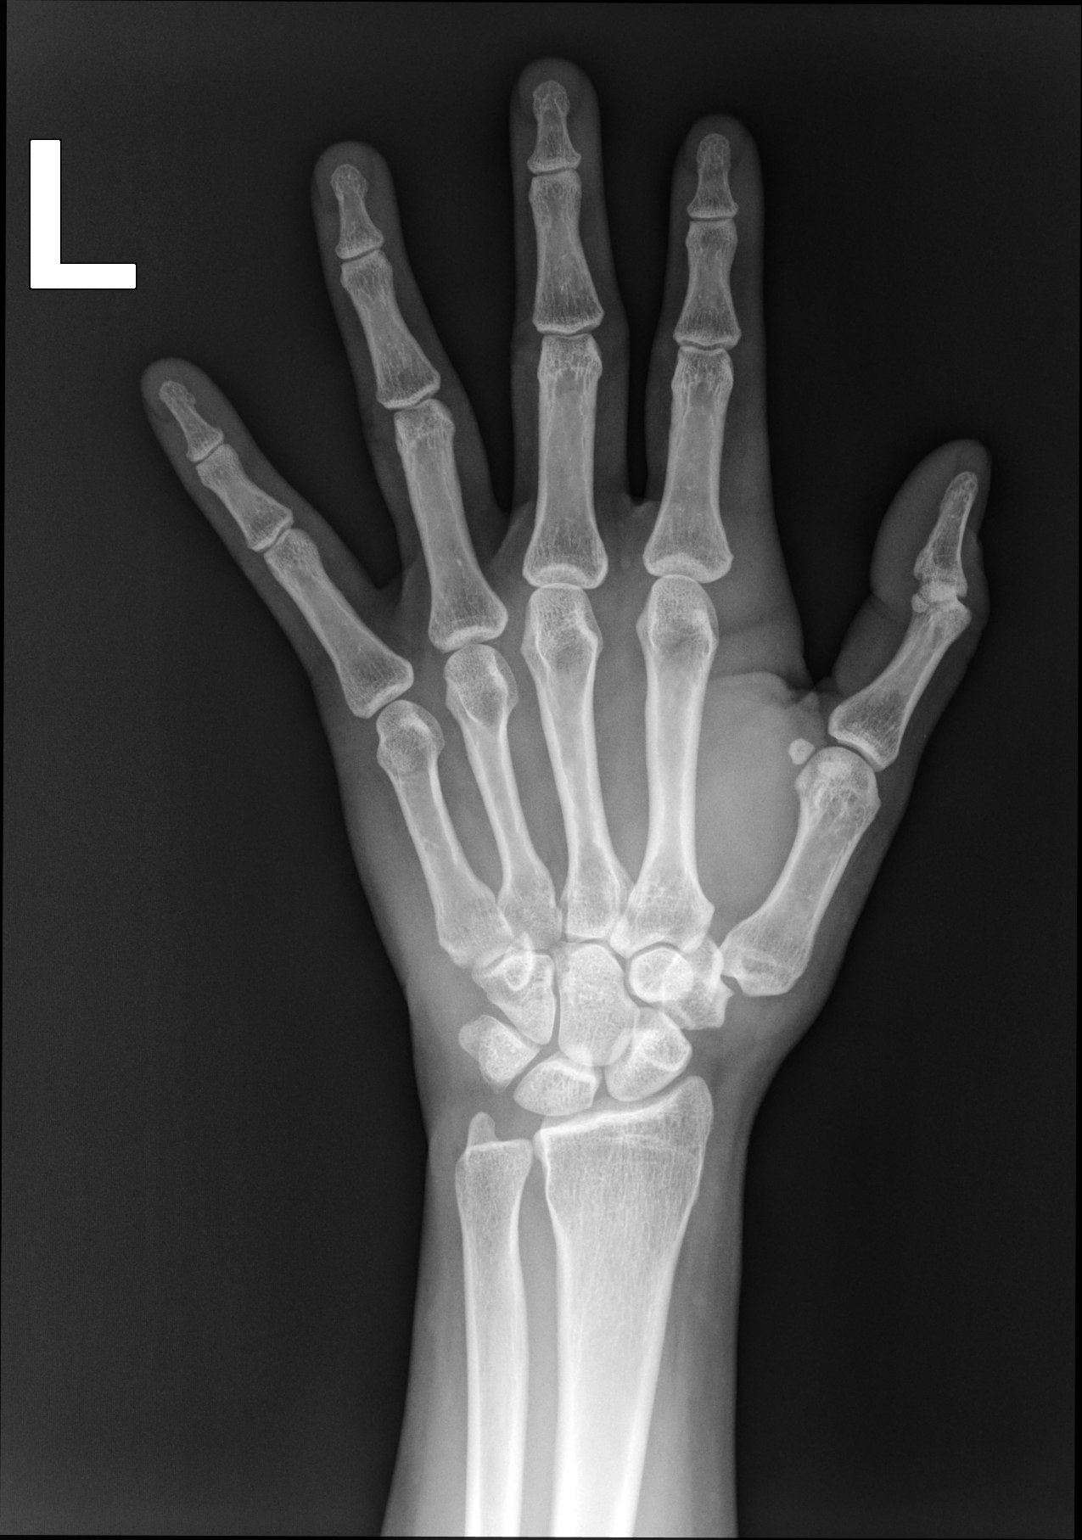

[hand mlo]
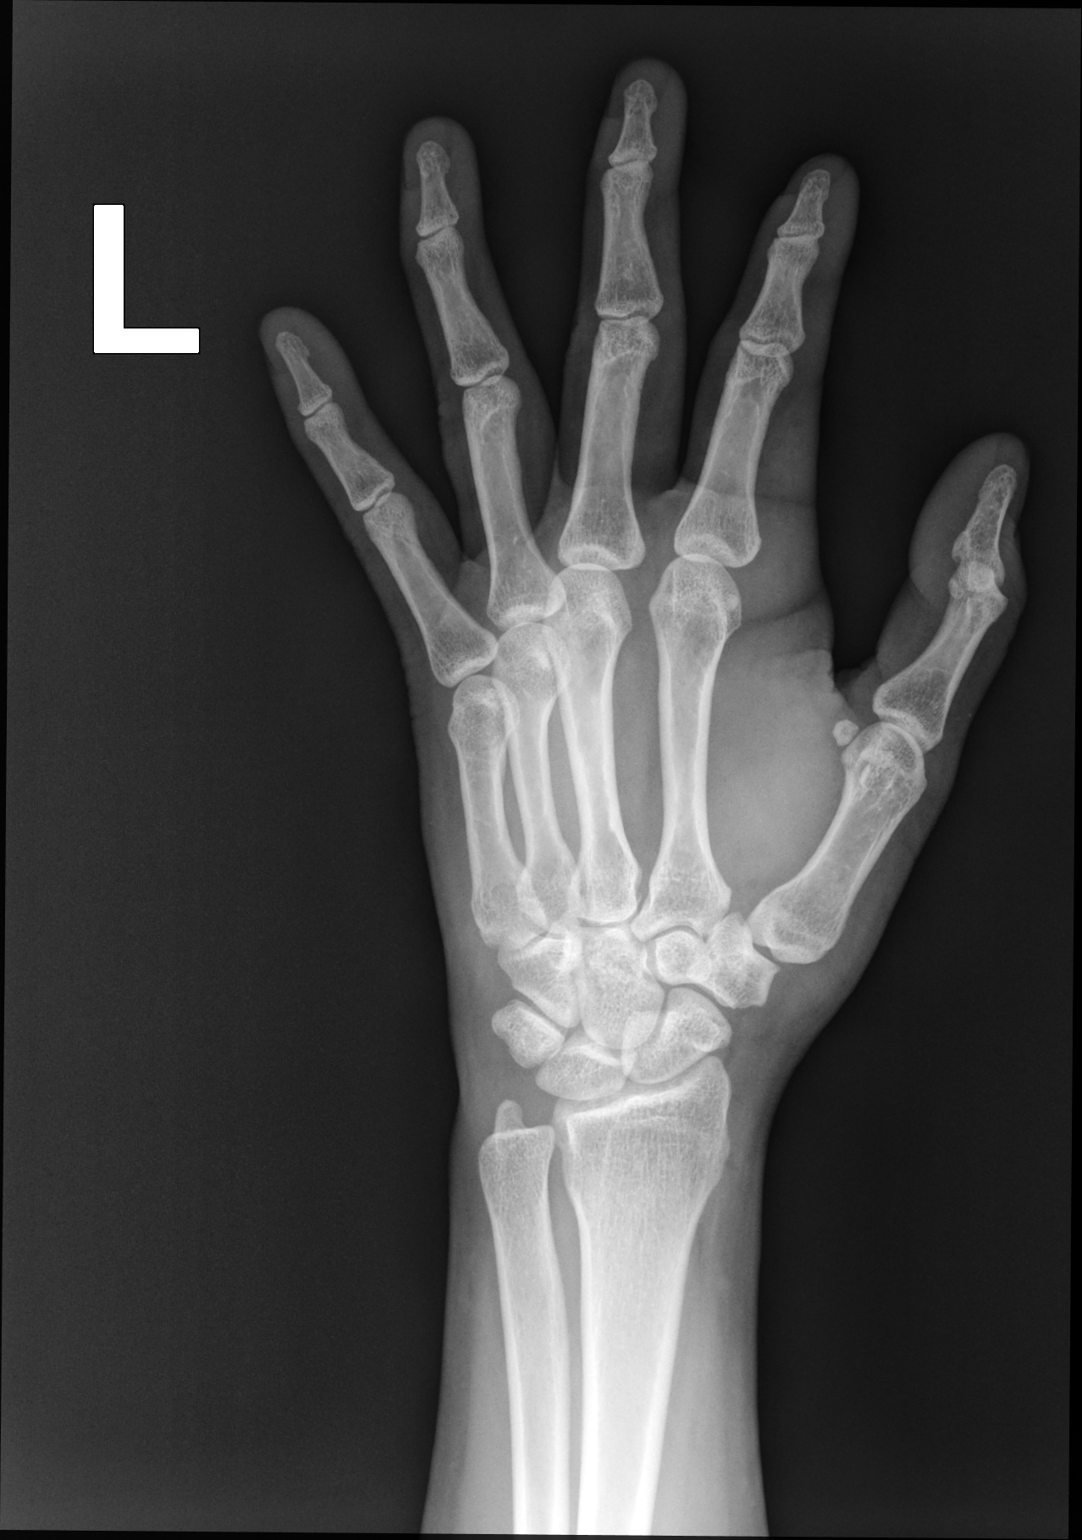

[hand lat]
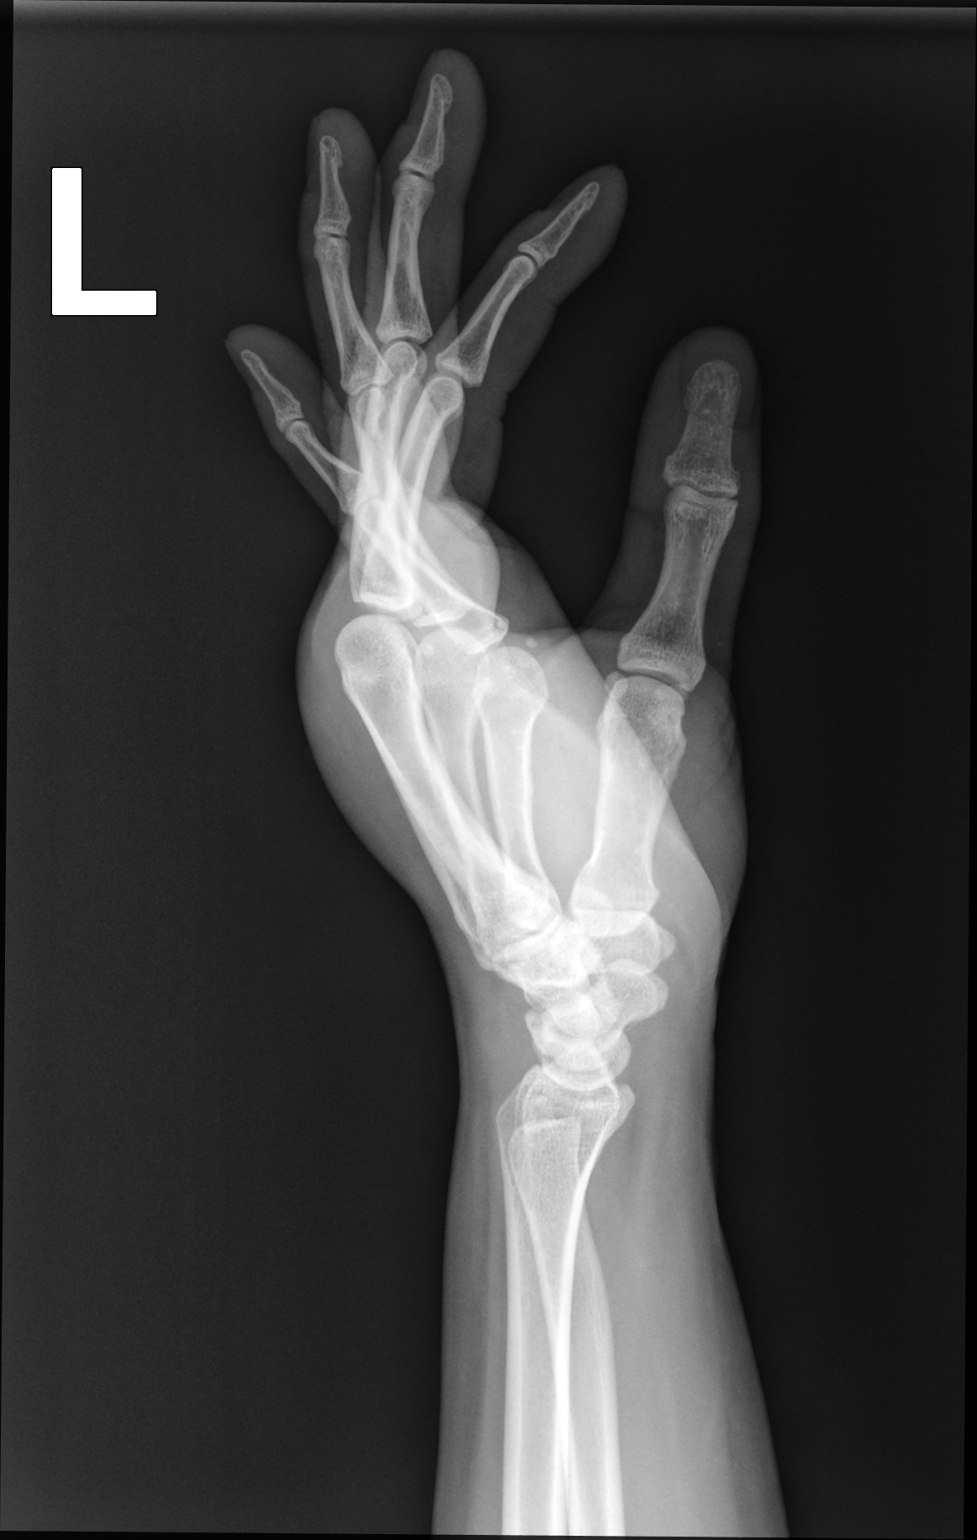

[3 of 3 positions shown; findings below may reference images not displayed]

FINDINGS: There is no evidence of fracture or dislocation. There is no
evidence of arthropathy or other focal bone abnormality. Soft tissue
edema overlies the metacarpals. No soft tissue gas. No radiopaque
foreign body.
IMPRESSION: Soft tissue edema without radiopaque foreign body or soft tissue
gas. No osseous abnormality.

## 2023-03-07 ENCOUNTER — Ambulatory Visit (INDEPENDENT_AMBULATORY_CARE_PROVIDER_SITE_OTHER): Payer: Commercial Managed Care - PPO

## 2023-03-07 DIAGNOSIS — Z23 Encounter for immunization: Secondary | ICD-10-CM

## 2023-03-29 ENCOUNTER — Ambulatory Visit
Admission: EM | Admit: 2023-03-29 | Discharge: 2023-03-29 | Disposition: A | Discharge status: Home / Self Care | Payer: Commercial Managed Care - PPO | Attending: Emergency Medicine | Admitting: Emergency Medicine

## 2023-03-29 DIAGNOSIS — J069 Acute upper respiratory infection, unspecified: Secondary | ICD-10-CM

## 2023-03-29 MED ORDER — AZITHROMYCIN 250 MG PO TABS: 250.0000 mg | ORAL_TABLET | Freq: Every day | ORAL | 0 refills | Status: DC

## 2023-03-29 MED ORDER — PROMETHAZINE-DM 6.25-15 MG/5ML PO SYRP: 5.0000 mL | ORAL_SOLUTION | Freq: Every evening | ORAL | 0 refills | Status: DC | PRN

## 2023-03-29 MED ORDER — BENZONATATE 100 MG PO CAPS: 100.0000 mg | ORAL_CAPSULE | Freq: Three times a day (TID) | ORAL | 0 refills | Status: DC

## 2023-03-29 NOTE — ED Provider Notes (Signed)
Renaldo Park    CSN: [id number] Arrival date & time: 03/29/23  1030      History   Chief Complaint Chief Complaint  Patient presents with   Cough    HPI Heather Park is a 42 y.o. female.   Patient presents for evaluation of nasal congestion, rhinorrhea, sore throat and a productive cough with green sputum present for 3 days.  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  Has presence of fever.  Has attempted use of over-the-counter medication which has provided minimal relief.  Denies respiratory history, non-smoker.  Denies shortness of breath or wheezing.  Past Medical History:  Diagnosis Date   Breast mass 07/25/2016   palp lump left breast 4 o'clock left breast x 3 years-increases in size with periods   Dog bite of right hand 11/24/2018   GERD (gastroesophageal reflux disease)    Thoracic back pain 05/12/2018    Patient Active Problem List   Diagnosis Date Noted   Hemoglobin E trait (HCC) 09/05/2022   Family history of blood dyscrasia 08/08/2022   Menorrhagia with regular cycle 04/03/2020   GERD (gastroesophageal reflux disease) 05/12/2018   Arthralgia 01/29/2018   Fatigue 06/18/2017   Fibroadenoma of left breast 12/25/2016   Preventative health care 07/16/2016   Breast mass 07/16/2016    Past Surgical History:  Procedure Laterality Date   CESAREAN SECTION     x2   CESAREAN SECTION     ESOPHAGOGASTRODUODENOSCOPY N/A 07/24/2020   Procedure: ESOPHAGOGASTRODUODENOSCOPY (EGD);  Surgeon: Toney Reil, MD;  Location: [facility] ENDOSCOPY;  Service: Gastroenterology;  Laterality: N/A;    OB History     Gravida  2   Para  2   Term      Preterm      AB      Living         SAB      IAB      Ectopic      Multiple      Live Births           Obstetric Comments  1st Menstrual Cycle: 13   1st Pregnancy:  28           Home Medications    Prior to Admission medications   Medication Sig Start Date End Date Taking?  Authorizing Provider  azithromycin (ZITHROMAX) 250 MG tablet Take 1 tablet (250 mg total) by mouth daily. Take first 2 tablets together, then 1 every day until finished. 04/02/23  Yes Sixto Bowdish R, NP  benzonatate (TESSALON) 100 MG capsule Take 1 capsule (100 mg total) by mouth every 8 (eight) hours. 03/29/23  Yes Donell Tomkins, Elita Boone, NP  promethazine-dextromethorphan (PROMETHAZINE-DM) 6.25-15 MG/5ML syrup Take 5 mLs by mouth at bedtime as needed for cough. 03/29/23  Yes Yaretzy Olazabal, Hansel Starling R, NP  b complex vitamins capsule Take 1 capsule by mouth daily.    [provider]  CALCIUM-MAGNESIUM-ZINC PO Take by mouth daily.    [provider]  etonogestrel-ethinyl estradiol (NUVARING) 0.12-0.015 MG/24HR vaginal ring USE VAGINALLY FOR THREE WEEKS THEN ONE WEEK OFF 06/01/20   [provider]  famotidine (PEPCID) 20 MG tablet Take 1 tablet (20 mg total) by mouth at bedtime. For heartburn Patient not taking: Reported on 08/14/2022 11/17/19   Doreene Nest, NP  FOLIC ACID PO Take by mouth. Patient not taking: Reported on 08/08/2022    [provider]  Multiple Vitamin (MULTIVITAMIN) tablet Take 1 tablet by mouth daily.  [provider]  predniSONE (DELTASONE) 10 MG tablet Take 4 tablets (40 mg) for four days, then 2 tablets (20 mg) for four days, then 1 tablet (10 mg) for four days. 09/10/22   Immordino, Jeannett Senior, FNP    Family History Family History  Problem Relation Age of Onset   Diabetes Father    Heart attack Maternal Grandmother    Breast cancer Paternal Aunt 53   Leukemia Maternal Uncle    Healthy Mother    Prostate cancer Paternal Colan Neptune' disease Sister     Social History Social History   Tobacco Use   Smoking status: Never   Smokeless tobacco: Never  Vaping Use   Vaping status: Never Used  Substance Use Topics   Alcohol use: Yes    Alcohol/week: 2.0 standard drinks of alcohol    Types: 2 Glasses of wine per week    Comment:  socially   Drug use: No     Allergies   Avocado, Other, and Oxycodone   Review of Systems Review of Systems  Respiratory:  Positive for cough.      Physical Exam Triage Vital Signs ED Triage Vitals  Encounter Vitals Group     BP 03/29/23 1042 113/77     Systolic BP Percentile --      Diastolic BP Percentile --      Pulse Rate 03/29/23 1042 (!) 101     Resp 03/29/23 1042 16     Temp 03/29/23 1042 (!) 97.3 F (36.3 C)     Temp Source 03/29/23 1042 Temporal     SpO2 03/29/23 1042 98 %     Weight --      Height --      Head Circumference --      Peak Flow --      Pain Score 03/29/23 1100 0     Pain Loc --      Pain Education --      Exclude from Growth Chart --    No data found.  Updated Vital Signs BP 113/77 (BP Location: Left Arm)   Pulse (!) 101   Temp (!) 97.3 F (36.3 C) (Temporal)   Resp 16   LMP 03/15/2023 (Approximate)   SpO2 98%   Visual Acuity Right Eye Distance:   Left Eye Distance:   Bilateral Distance:    Right Eye Near:   Left Eye Near:    Bilateral Near:     Physical Exam Constitutional:      Appearance: Normal appearance.  HENT:     Head: Normocephalic.     Right Ear: Tympanic membrane, ear canal and external ear normal.     Left Ear: Tympanic membrane, ear canal and external ear normal.     Nose: Congestion present. No rhinorrhea.     Mouth/Throat:     Mouth: Mucous membranes are moist.     Pharynx: Oropharynx is clear. No oropharyngeal exudate or posterior oropharyngeal erythema.  Eyes:     Extraocular Movements: Extraocular movements intact.  Cardiovascular:     Rate and Rhythm: Normal rate and regular rhythm.     Pulses: Normal pulses.     Heart sounds: Normal heart sounds.  Pulmonary:     Effort: Pulmonary effort is normal.     Breath sounds: Normal breath sounds.  Musculoskeletal:     Cervical back: Normal range of motion and neck supple.  Neurological:     Mental Status: She is alert and oriented to person, place,  and  time. Mental status is at baseline.      UC Treatments / Results  Labs (all labs ordered are listed, but only abnormal results are displayed) Labs Reviewed - No data to display  EKG   Radiology No results found.  Procedures Procedures (including critical care time)  Medications Ordered in UC Medications - No data to display  Initial Impression / Assessment and Plan / UC Course  I have reviewed the triage vital signs and the nursing notes.  Pertinent labs & imaging results that were available during my care of the patient were reviewed by me and considered in my medical decision making (see chart for details).  Viral URI with cough  Patient is in no signs of distress nor toxic appearing.  Vital signs are stable.  Low suspicion for pneumonia, pneumothorax or bronchitis and therefore will defer imaging.  Declined COVID testing.  Prescribed Tessalon and Promethazine DM, watch wait antibiotic placed at pharmacy if no improvement seen, azithromycin prescribed.May use additional over-the-counter medications as needed for supportive care.  May follow-up with urgent care as needed if symptoms persist or worsen.  Final Clinical Impressions(s) / UC Diagnoses   Final diagnoses:  Viral URI with cough     Discharge Instructions      Your symptoms today are most likely being caused by a virus and should steadily improve in time it can take up to 7 to 10 days before you truly start to see a turnaround however things will get better, if no improvement seen by Wednesday, November 20 17 may begin use of azithromycin to provide coverage for bacteria  In the meantime you may use Tessalon pill every 8 hours as needed to help with your coughing, may use cough syrup at bedtime to help you sleep    You can take Tylenol and/or Ibuprofen as needed for fever reduction and pain relief.   For cough: honey 1/2 to 1 teaspoon (you can dilute the honey in water or another fluid).  You can also use  guaifenesin and dextromethorphan for cough. You can use a humidifier for chest congestion and cough.  If you don't have a humidifier, you can sit in the bathroom with the hot shower running.      For sore throat: try warm salt water gargles, cepacol lozenges, throat spray, warm tea or water with lemon/honey, popsicles or ice, or OTC cold relief medicine for throat discomfort.   For congestion: take a daily anti-histamine like Zyrtec, Claritin, and a oral decongestant, such as pseudoephedrine.  You can also use Flonase 1-2 sprays in each nostril daily.   It is important to stay hydrated: drink plenty of fluids (water, gatorade/powerade/pedialyte, juices, or teas) to keep your throat moisturized and help further relieve irritation/discomfort.    ED Prescriptions     Medication Sig Dispense Auth. Provider   benzonatate (TESSALON) 100 MG capsule Take 1 capsule (100 mg total) by mouth every 8 (eight) hours. 21 capsule Areli Frary R, NP   promethazine-dextromethorphan (PROMETHAZINE-DM) 6.25-15 MG/5ML syrup Take 5 mLs by mouth at bedtime as needed for cough. 118 mL Burwell Bethel R, NP   azithromycin (ZITHROMAX) 250 MG tablet Take 1 tablet (250 mg total) by mouth daily. Take first 2 tablets together, then 1 every day until finished. 6 tablet Valinda Hoar, NP      PDMP not reviewed this encounter.   Valinda Hoar, NP 03/29/23 1345

## 2023-03-29 NOTE — Discharge Instructions (Signed)
Your symptoms today are most likely being caused by a virus and should steadily improve in time it can take up to 7 to 10 days before you truly start to see a turnaround however things will get better, if no improvement seen by Wednesday, November 20 17 may begin use of azithromycin to provide coverage for bacteria  In the meantime you may use Tessalon pill every 8 hours as needed to help with your coughing, may use cough syrup at bedtime to help you sleep    You can take Tylenol and/or Ibuprofen as needed for fever reduction and pain relief.   For cough: honey 1/2 to 1 teaspoon (you can dilute the honey in water or another fluid).  You can also use guaifenesin and dextromethorphan for cough. You can use a humidifier for chest congestion and cough.  If you don't have a humidifier, you can sit in the bathroom with the hot shower running.      For sore throat: try warm salt water gargles, cepacol lozenges, throat spray, warm tea or water with lemon/honey, popsicles or ice, or OTC cold relief medicine for throat discomfort.   For congestion: take a daily anti-histamine like Zyrtec, Claritin, and a oral decongestant, such as pseudoephedrine.  You can also use Flonase 1-2 sprays in each nostril daily.   It is important to stay hydrated: drink plenty of fluids (water, gatorade/powerade/pedialyte, juices, or teas) to keep your throat moisturized and help further relieve irritation/discomfort.

## 2023-03-29 NOTE — ED Triage Notes (Signed)
Patient presents to UC for cough, sore throat x 3 days. Treating symptoms with robitussin.

## 2023-06-16 ENCOUNTER — Encounter: Payer: Self-pay | Admitting: Internal Medicine

## 2023-06-16 ENCOUNTER — Ambulatory Visit (INDEPENDENT_AMBULATORY_CARE_PROVIDER_SITE_OTHER): Payer: BC Managed Care – PPO | Admitting: Internal Medicine

## 2023-06-16 ENCOUNTER — Ambulatory Visit: Payer: Self-pay | Admitting: Primary Care

## 2023-06-16 VITALS — BP 90/58 | HR 86 | Temp 98.8°F | Ht 60.0 in | Wt 128.0 lb

## 2023-06-16 DIAGNOSIS — R509 Fever, unspecified: Secondary | ICD-10-CM | POA: Diagnosis not present

## 2023-06-16 LAB — POC URINALSYSI DIPSTICK (AUTOMATED)
Bilirubin, UA: NEGATIVE
Blood, UA: NEGATIVE
Glucose, UA: NEGATIVE
Ketones, UA: NEGATIVE
Leukocytes, UA: NEGATIVE
Nitrite, UA: NEGATIVE
Protein, UA: NEGATIVE
Spec Grav, UA: <=1.005 — AB (ref 1.010–1.025)
Urobilinogen, UA: 0.2 U/dL
pH, UA: 5.5 (ref 5.0–8.0)

## 2023-06-16 NOTE — Assessment & Plan Note (Signed)
 Mild urinary symptoms----could be related to nuvaring Urinalysis normal--not sure if she could have partially treated herself More likely attenuated flu or other viral infection Will have her finish out a week of the bactrim though--just in case Continue analgesics Going to gyn tomorrow to have the nuvaring checked

## 2023-06-16 NOTE — Telephone Encounter (Signed)
 Copied from CRM [id number]. Topic: Clinical - Red Word Triage >> Jun 16, 2023  8:10 AM Melissa C wrote: [facility] that prompted transfer to Nurse Triage: patient feels like she has a UTI and has a low grade fever, also has some chills so not really sure what is going on  Chief Complaint: urinary pain Symptoms: "tingling" with urination, fever Frequency: started Sunday Pertinent Negatives: Patient denies sob, dizziness, flank pain, blood in urine. Disposition: [] ED /[] Urgent Care (no appt availability in office) / [] Appointment(In office/virtual)/ []  [place] [facility]/ [] Home Care/ [] Refused Recommended Disposition /[] [place] [facility]/ []  Follow-up with PCP Additional Notes: apt made for today; denies questions of care advice; instructed to go to the er if becomes worse.   Reason for Disposition  All other patients with painful urination  (Exception: [1] EITHER frequency or urgency AND [2] has on-call doctor.)  Answer Assessment - Initial Assessment Questions 1. SEVERITY: "How bad is the pain?"  (e.g., Scale 1-10; mild, moderate, or severe)   - MILD (1-3): complains slightly about urination hurting   - MODERATE (4-7): interferes with normal activities     - SEVERE (8-10): excruciating, unwilling or unable to urinate because of the pain      Mild - "tingling" 2. FREQUENCY: "How many times have you had painful urination today?"      once 3. PATTERN: "Is pain present every time you urinate or just sometimes?"      sometimes 4. ONSET: "When did the painful urination start?"      Sunday 5. FEVER: "Do you have a fever?" If Yes, ask: "What is your temperature, how was it measured, and when did it start?"     yes 6. PAST UTI: "Have you had a urine infection before?" If Yes, ask: "When was the last time?" and "What happened that time?"      yes 7. CAUSE: "What do you think is causing the painful urination?"  (e.g., UTI, scratch, Herpes sore)     uti 8. OTHER SYMPTOMS: "Do you have any  other symptoms?" (e.g., blood in urine, flank pain, genital sores, urgency, vaginal discharge)     Denies.  9. PREGNANCY: "Is there any chance you are pregnant?" "When was your last menstrual period?"     na  Protocols used: Urination Pain - Female-A-AH

## 2023-06-16 NOTE — Telephone Encounter (Signed)
Okay I will check her at the [place] today

## 2023-06-16 NOTE — Progress Notes (Signed)
 Subjective:    Patient ID: Heather Park, female    DOB: 10/06/1980, 43 y.o.   MRN: [id number]  HPI Here due to fever and pelvic pain  Here due to fever Started 5 days ago---and ongoing Had been making a candle and is sensitive---thought it was related to the scent Back bothered her--then weak No cough or sneezing No sore throat--but feels pain in back with swallowing (had same thing years ago--leading to EGD) Did restart nexium ---some better now  High temp 101 Some chills--and will have sweat after tylenol (wakes with it) Some swollen lymph nodes in left neck in past 2 days No other myalgia  Having pain from Novaring---took it out briefly (but has put it back in) Still some pain Slight burning dysuria--but not severe Drinking a lot of water  Took 2 days of left over bactrim (but was expired) Chills continued  Works in health care---nurse at [employer] No ill family contacts  Current Outpatient Medications on File Prior to Visit  Medication Sig Dispense Refill   b complex vitamins capsule Take 1 capsule by mouth daily.     CALCIUM-MAGNESIUM -ZINC PO Take by mouth daily.     etonogestrel-ethinyl estradiol (NUVARING) 0.12-0.015 MG/24HR vaginal ring USE VAGINALLY FOR THREE WEEKS THEN ONE WEEK OFF     Multiple Vitamin (MULTIVITAMIN) tablet Take 1 tablet by mouth daily.     azithromycin  (ZITHROMAX ) 250 MG tablet Take 1 tablet (250 mg total) by mouth daily. Take first 2 tablets together, then 1 every day until finished. (Patient not taking: Reported on 06/16/2023) 6 tablet 0   benzonatate  (TESSALON ) 100 MG capsule Take 1 capsule (100 mg total) by mouth every 8 (eight) hours. (Patient not taking: Reported on 06/16/2023) 21 capsule 0   famotidine  (PEPCID ) 20 MG tablet Take 1 tablet (20 mg total) by mouth at bedtime. For heartburn (Patient not taking: Reported on 06/16/2023) 90 tablet 1   FOLIC ACID  PO Take by mouth. (Patient not taking: Reported on 06/16/2023)     predniSONE  (DELTASONE )  10 MG tablet Take 4 tablets (40 mg) for four days, then 2 tablets (20 mg) for four days, then 1 tablet (10 mg) for four days. (Patient not taking: Reported on 06/16/2023) 28 tablet 0   promethazine -dextromethorphan (PROMETHAZINE -DM) 6.25-15 MG/5ML syrup Take 5 mLs by mouth at bedtime as needed for cough. (Patient not taking: Reported on 06/16/2023) 118 mL 0   No current facility-administered medications on file prior to visit.    Allergies  Allergen Reactions   Avocado Itching   Other    Oxycodone Itching    Past Medical History:  Diagnosis Date   Breast mass 07/25/2016   palp lump left breast 4 o'clock left breast x 3 years-increases in size with periods   Dog bite of right hand 11/24/2018   GERD (gastroesophageal reflux disease)    Thoracic back pain 05/12/2018    Past Surgical History:  Procedure Laterality Date   CESAREAN SECTION     x2   CESAREAN SECTION     ESOPHAGOGASTRODUODENOSCOPY N/A 07/24/2020   Procedure: ESOPHAGOGASTRODUODENOSCOPY (EGD);  Surgeon: Selena Daily, MD;  Location: [facility] ENDOSCOPY;  Service: Gastroenterology;  Laterality: N/A;    Family History  Problem Relation Age of Onset   Diabetes Father    Heart attack Maternal Grandmother    Breast cancer Paternal Aunt 53   Leukemia Maternal Uncle    Healthy Mother    Prostate cancer Paternal Thelbert Finner' disease Sister  Social History   Socioeconomic History   Marital status: Married    Spouse name: Not on file   Number of children: Not on file   Years of education: Not on file   Highest education level: Not on file  Occupational History   Not on file  Tobacco Use   Smoking status: Never   Smokeless tobacco: Never  Vaping Use   Vaping status: Never Used  Substance and Sexual Activity   Alcohol use: Yes    Alcohol/week: 2.0 standard drinks of alcohol    Types: 2 Glasses of wine per week    Comment: socially   Drug use: No   Sexual activity: Yes    Birth control/protection: Inserts   Other Topics Concern   Not on file  Social History Narrative   Married.   2 children.   Works at [employer] as Engineer, civil (consulting).   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    Social Drivers of Corporate investment banker Strain: Not on file  Food Insecurity: No Food Insecurity (08/14/2022)   Hunger Vital Sign    Worried About Running Out of Food in the Last Year: Never true    Ran Out of Food in the Last Year: Never true  Transportation Needs: No Transportation Needs (08/14/2022)   PRAPARE - Administrator, Civil Service (Medical): No    Lack of Transportation (Non-Medical): No  Physical Activity: Not on file  Stress: Not on file  Social Connections: Not on file  Intimate Partner Violence: Not At Risk (08/14/2022)   Humiliation, Afraid, Rape, and Kick questionnaire    Fear of Current or Ex-Partner: No    Emotionally Abused: No    Physically Abused: No    Sexually Abused: No   Review of Systems No N/V No abdominal pain Married and monogamous    Objective:   Physical Exam Constitutional:      Appearance: Normal appearance.  HENT:     Head:     Comments: No sinus tenderness    Right Ear: Tympanic membrane and ear canal normal.     Left Ear: Tympanic membrane and ear canal normal.     Mouth/Throat:     Pharynx: No oropharyngeal exudate or posterior oropharyngeal erythema.  Cardiovascular:     Rate and Rhythm: Normal rate and regular rhythm.     Heart sounds: No murmur heard.    No gallop.  Pulmonary:     Effort: Pulmonary effort is normal.     Breath sounds: Normal breath sounds. No wheezing or rales.  Abdominal:     Palpations: Abdomen is soft.     Tenderness: There is no abdominal tenderness.  Musculoskeletal:     Cervical back: Neck supple.  Lymphadenopathy:     Cervical: No cervical adenopathy.  Neurological:     Mental Status: She is alert.            Assessment & Plan:

## 2023-06-20 ENCOUNTER — Ambulatory Visit: Payer: BC Managed Care – PPO

## 2023-06-20 ENCOUNTER — Other Ambulatory Visit: Payer: Self-pay

## 2023-06-20 ENCOUNTER — Ambulatory Visit: Payer: BC Managed Care – PPO | Admitting: Nurse Practitioner

## 2023-06-20 ENCOUNTER — Encounter: Payer: Self-pay | Admitting: Nurse Practitioner

## 2023-06-20 ENCOUNTER — Encounter: Payer: Self-pay | Admitting: Emergency Medicine

## 2023-06-20 ENCOUNTER — Ambulatory Visit: Payer: Self-pay | Admitting: Primary Care

## 2023-06-20 VITALS — BP 120/82 | HR 84 | Temp 98.0°F | Ht 60.0 in | Wt 125.8 lb

## 2023-06-20 DIAGNOSIS — R509 Fever, unspecified: Secondary | ICD-10-CM | POA: Diagnosis present

## 2023-06-20 DIAGNOSIS — Z20822 Contact with and (suspected) exposure to covid-19: Secondary | ICD-10-CM | POA: Insufficient documentation

## 2023-06-20 LAB — COMPREHENSIVE METABOLIC PANEL
ALT: 72 U/L — ABNORMAL HIGH (ref 0–35)
AST: 73 U/L — ABNORMAL HIGH (ref 0–37)
Albumin: 3.9 g/dL (ref 3.5–5.2)
Alkaline Phosphatase: 220 U/L — ABNORMAL HIGH (ref 39–117)
BUN: 8 mg/dL (ref 6–23)
CO2: 29 meq/L (ref 19–32)
Calcium: 8.8 mg/dL (ref 8.4–10.5)
Chloride: 100 meq/L (ref 96–112)
Creatinine, Ser: 0.65 mg/dL (ref 0.40–1.20)
GFR: 109.45 mL/min (ref >60.00)
Glucose, Bld: 83 mg/dL (ref 70–99)
Potassium: 4.1 meq/L (ref 3.5–5.1)
Sodium: 136 meq/L (ref 135–145)
Total Bilirubin: 0.2 mg/dL (ref 0.2–1.2)
Total Protein: 7.5 g/dL (ref 6.0–8.3)

## 2023-06-20 LAB — CBC WITH DIFFERENTIAL/PLATELET
Basophils Absolute: 0 10*3/uL (ref 0.0–0.1)
Basophils Relative: 0.6 % (ref 0.0–3.0)
Eosinophils Absolute: 0 10*3/uL (ref 0.0–0.7)
Eosinophils Relative: 0 % (ref 0.0–5.0)
HCT: 38.1 % (ref 36.0–46.0)
Hemoglobin: 12.7 g/dL (ref 12.0–15.0)
Lymphocytes Relative: 44 % (ref 12.0–46.0)
Lymphs Abs: 1.6 10*3/uL (ref 0.7–4.0)
MCHC: 33.3 g/dL (ref 30.0–36.0)
MCV: 78.5 fL (ref 78.0–100.0)
Monocytes Absolute: 0.4 10*3/uL (ref 0.1–1.0)
Monocytes Relative: 10.4 % (ref 3.0–12.0)
Neutro Abs: 1.7 10*3/uL (ref 1.4–7.7)
Neutrophils Relative %: 45 % (ref 43.0–77.0)
Platelets: 148 10*3/uL — ABNORMAL LOW (ref 150.0–400.0)
RBC: 4.85 Mil/uL (ref 3.87–5.11)
RDW: 12.8 % (ref 11.5–15.5)
WBC: 3.7 10*3/uL — ABNORMAL LOW (ref 4.0–10.5)

## 2023-06-20 LAB — POC URINALSYSI DIPSTICK (AUTOMATED)
Bilirubin, UA: NEGATIVE
Glucose, UA: NEGATIVE
Ketones, UA: NEGATIVE
Leukocytes, UA: NEGATIVE
Nitrite, UA: NEGATIVE
Protein, UA: NEGATIVE
Spec Grav, UA: <=1.005 — AB (ref 1.010–1.025)
Urobilinogen, UA: 0.2 U/dL
pH, UA: 5.5 (ref 5.0–8.0)

## 2023-06-20 LAB — URINALYSIS, ROUTINE W REFLEX MICROSCOPIC
Bilirubin Urine: NEGATIVE
Hgb urine dipstick: NEGATIVE
Ketones, ur: NEGATIVE
Leukocytes,Ua: NEGATIVE
Nitrite: NEGATIVE
Specific Gravity, Urine: <=1.005 — AB (ref 1.000–1.030)
Total Protein, Urine: NEGATIVE
Urine Glucose: NEGATIVE
Urobilinogen, UA: 0.2 (ref 0.0–1.0)
pH: 6 (ref 5.0–8.0)

## 2023-06-20 LAB — SEDIMENTATION RATE: Sed Rate: 76 mm/h — ABNORMAL HIGH (ref 0–20)

## 2023-06-20 LAB — POCT INFLUENZA A/B
Influenza A, POC: NEGATIVE
Influenza B, POC: NEGATIVE

## 2023-06-20 LAB — POC COVID19 BINAXNOW: SARS Coronavirus 2 Ag: NEGATIVE

## 2023-06-20 NOTE — Telephone Encounter (Signed)
  Chief Complaint: fever Symptoms: fever, mid back pain, chills, headache, cough Frequency: greater than 1 week Pertinent Negatives: Patient denies CP, SOB, NVD Disposition: [] ED /[] Urgent Care (no appt availability in office) / [x] Appointment(In office/virtual)/ []  [facility]/ [] Home Care/ [] Refused Recommended Disposition /[] [facility]/ []  Follow-up with PCP Additional Notes: Patient calls reporting continuing fever for over 1 week, mid back pain, cough, chills, headache. States she was seen in clinic last week, but symptoms are not resolving. Per protocol, patient to be evaluated within 24 hours. First available appointment with PCP or any provider in clinic outside of recommendation. Patient scheduled with first available provider in alternate clinic for 1140 today at patient request. Care advice reviewed, patient verbalized understandingand denies further questions at this time. Alerting PCP for review.    Copied from CRM [id number]. Topic: Clinical - Pink Word Triage >> Jun 20, 2023  9:21 AM Fuller Mandril wrote: Reason for Triage: Patient called states she was seen for fever recently. Provider did UA. Patient would like to be seen again still has fever. This morning was 101.2. Has been about 10 days. Would like some further diagnostic testing. Thank You Reason for Disposition  Fever present > 3 days (72 hours)  Answer Assessment - Initial Assessment Questions 1. TEMPERATURE: "What is the most recent temperature?"  "How was it measured?"      101.74F this morning orally- without medication. 2. ONSET: "When did the fever start?"      Last week, was evaluated in office, but fever is not resolving. 3. CHILLS: "Do you have chills?" If yes: "How bad are they?"  (e.g., none, mild, moderate, severe)   - NONE: no chills   - MILD: feeling cold   - MODERATE: feeling very cold, some shivering (feels better under a thick blanket)   - SEVERE: feeling extremely cold with shaking  chills (general body shaking, rigors; even under a thick blanket)      Moderate 4. OTHER SYMPTOMS: "Do you have any other symptoms besides the fever?"  (e.g., abdomen pain, cough, diarrhea, earache, headache, sore throat, urination pain)     Cough, mid back pain, headache,  5. CAUSE: If there are no symptoms, ask: "What do you think is causing the fever?"      Unsure 6. 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 TREATMENT: "What have you done so far to treat this fever?" (e.g., medications)     Tylenol 8. IMMUNOCOMPROMISE: "Do you have of the following: diabetes, HIV positive, splenectomy, cancer chemotherapy, chronic steroid treatment, transplant patient, etc."     Denies 9. PREGNANCY: "Is there any chance you are pregnant?" "When was your last menstrual period?"     LMP: nova ring, will begin within 3 days 10. TRAVEL: "Have you traveled out of the country in the last month?" (e.g., travel history, exposures)       Denies  Protocols used: [facility]

## 2023-06-20 NOTE — Progress Notes (Addendum)
 Established Patient Office Visit  Subjective:  Patient ID: Glada Wickstrom, female    DOB: 11-Feb-1981  Age: 43 y.o. MRN: [id number]  CC:  Chief Complaint  Patient presents with   Acute Visit    Worsening fever   Discussed the use of a AI scribe software for clinical note transcription with the patient, who gave verbal consent to proceed.   HPI  Akeema Broder is a 43 year old female who presents with fever and back pain.  She has been experiencing fever and back pain, initially noticing weakness and a low-grade fever of 59F last Wednesday night. Over the weekend, the fever increased to 101F, and she began taking expired Bactrim for two days before stopping. Her fever persisted, reaching 102F yesterday and 101F this morning. The back pain is particularly noticeable in the morning and at night when the fever is present. No chills, runny nose, ear congestion, or urinary symptoms are reported.  She also reports a headache accompanying the fever and back pain. The headache is present primarily in the morning and at night.  No cough, except for occasional coughing to clear phlegm, which started after stopping Bactrim. No history of COVID or RSV testing   HPI   Past Medical History:  Diagnosis Date   Breast mass 07/25/2016   palp lump left breast 4 o'clock left breast x 3 years-increases in size with periods   Dog bite of right hand 11/24/2018   GERD (gastroesophageal reflux disease)    Thoracic back pain 05/12/2018    Past Surgical History:  Procedure Laterality Date   CESAREAN SECTION     x2   CESAREAN SECTION     ESOPHAGOGASTRODUODENOSCOPY N/A 07/24/2020   Procedure: ESOPHAGOGASTRODUODENOSCOPY (EGD);  Surgeon: Toney Reil, MD;  Location: [facility] ENDOSCOPY;  Service: Gastroenterology;  Laterality: N/A;    Family History  Problem Relation Age of Onset   Diabetes Father    Heart attack Maternal Grandmother    Breast cancer Paternal Aunt 64   Leukemia Maternal Uncle     Healthy Mother    Prostate cancer Paternal Colan Neptune' disease Sister     Social History   Socioeconomic History   Marital status: Married    Spouse name: Not on file   Number of children: Not on file   Years of education: Not on file   Highest education level: Not on file  Occupational History   Not on file  Tobacco Use   Smoking status: Never   Smokeless tobacco: Never  Vaping Use   Vaping status: Never Used  Substance and Sexual Activity   Alcohol use: Yes    Alcohol/week: 2.0 standard drinks of alcohol    Types: 2 Glasses of wine per week    Comment: socially   Drug use: No   Sexual activity: Yes    Birth control/protection: Inserts  Other Topics Concern   Not on file  Social History Narrative   Married.   2 children.   Works at [employer] as Engineer, civil (consulting).   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    Social Drivers of Corporate investment banker Strain: Not on file  Food Insecurity: No Food Insecurity (08/14/2022)   Hunger Vital Sign    Worried About Running Out of Food in the Last Year: Never true    Ran Out of Food in the Last Year: Never true  Transportation Needs: No Transportation Needs (08/14/2022)   PRAPARE - Transportation    Lack of  Transportation (Medical): No    Lack of Transportation (Non-Medical): No  Physical Activity: Not on file  Stress: Not on file  Social Connections: Not on file  Intimate Partner Violence: Not At Risk (08/14/2022)   Humiliation, Afraid, Rape, and Kick questionnaire    Fear of Current or Ex-Partner: No    Emotionally Abused: No    Physically Abused: No    Sexually Abused: No     Outpatient Medications Prior to Visit  Medication Sig Dispense Refill   b complex vitamins capsule Take 1 capsule by mouth daily.     CALCIUM-MAGNESIUM-ZINC PO Take by mouth daily.     etonogestrel-ethinyl estradiol (NUVARING) 0.12-0.015 MG/24HR vaginal ring USE VAGINALLY FOR THREE WEEKS THEN ONE WEEK OFF     Multiple Vitamin (MULTIVITAMIN)  tablet Take 1 tablet by mouth daily.     azithromycin (ZITHROMAX) 250 MG tablet Take 1 tablet (250 mg total) by mouth daily. Take first 2 tablets together, then 1 every day until finished. (Patient not taking: Reported on 06/16/2023) 6 tablet 0   benzonatate (TESSALON) 100 MG capsule Take 1 capsule (100 mg total) by mouth every 8 (eight) hours. (Patient not taking: Reported on 06/16/2023) 21 capsule 0   famotidine (PEPCID) 20 MG tablet Take 1 tablet (20 mg total) by mouth at bedtime. For heartburn (Patient not taking: Reported on 06/16/2023) 90 tablet 1   FOLIC ACID PO Take by mouth. (Patient not taking: Reported on 06/16/2023)     predniSONE (DELTASONE) 10 MG tablet Take 4 tablets (40 mg) for four days, then 2 tablets (20 mg) for four days, then 1 tablet (10 mg) for four days. (Patient not taking: Reported on 06/16/2023) 28 tablet 0   promethazine-dextromethorphan (PROMETHAZINE-DM) 6.25-15 MG/5ML syrup Take 5 mLs by mouth at bedtime as needed for cough. (Patient not taking: Reported on 06/16/2023) 118 mL 0   No facility-administered medications prior to visit.    Allergies  Allergen Reactions   Avocado Itching   Other    Oxycodone Itching    ROS Review of Systems Negative unless indicated in HPI.    Objective:    Physical Exam Constitutional:      Appearance: Normal appearance.  HENT:     Head: Normocephalic.     Right Ear: Tympanic membrane normal.     Left Ear: Tympanic membrane normal.     Nose: Nose normal. No congestion.     Mouth/Throat:     Mouth: Mucous membranes are moist.  Cardiovascular:     Rate and Rhythm: Normal rate and regular rhythm.     Pulses: Normal pulses.     Heart sounds: Normal heart sounds.  Pulmonary:     Effort: Pulmonary effort is normal.     Breath sounds: Normal breath sounds.  Abdominal:     General: Bowel sounds are normal.     Palpations: Abdomen is soft.     Tenderness: There is no abdominal tenderness. There is no right CVA tenderness or left  CVA tenderness.  Musculoskeletal:     Cervical back: Normal range of motion.  Skin:    General: Skin is warm.     Findings: No rash.  Neurological:     General: No focal deficit present.     Mental Status: She is alert. Mental status is at baseline.  Psychiatric:        Mood and Affect: Mood normal.        Behavior: Behavior normal.        Thought  Content: Thought content normal.        Judgment: Judgment normal.     BP 120/82   Pulse 84   Temp 98 F (36.7 C)   Ht 5' (1.524 m)   Wt 125 lb 12.8 oz (57.1 kg)   SpO2 99%   BMI 24.57 kg/m  Wt Readings from Last 3 Encounters:  06/20/23 125 lb 12.8 oz (57.1 kg)  06/20/23 125 lb 12.8 oz (57.1 kg)  06/16/23 128 lb (58.1 kg)     Health Maintenance  Topic Date Due   Hepatitis C Screening  Never done   Cervical Cancer Screening (HPV/Pap Cotest)  12/27/2022   COVID-19 Vaccine (1) 07/02/2023 (Originally 02/28/1987)   DTaP/Tdap/Td (3 - Td or Tdap) 11/23/2028   INFLUENZA VACCINE  Completed   HIV Screening  Completed   HPV VACCINES  Aged Out    There are no preventive care reminders to display for this patient.  Lab Results  Component Value Date   TSH 0.818 08/14/2022   Lab Results  Component Value Date   WBC 3.7 (L) 06/20/2023   HGB 12.7 06/20/2023   HCT 38.1 06/20/2023   MCV 78.5 06/20/2023   PLT 148.0 (L) 06/20/2023   Lab Results  Component Value Date   NA 136 06/20/2023   K 4.1 06/20/2023   CO2 29 06/20/2023   GLUCOSE 83 06/20/2023   BUN 8 06/20/2023   CREATININE 0.65 06/20/2023   BILITOT 0.2 06/20/2023   ALKPHOS 220 (H) 06/20/2023   AST 73 (H) 06/20/2023   ALT 72 (H) 06/20/2023   PROT 7.5 06/20/2023   ALBUMIN 3.9 06/20/2023   CALCIUM 8.8 06/20/2023   GFR 109.45 06/20/2023   Lab Results  Component Value Date   CHOL 214 (H) 09/05/2021   Lab Results  Component Value Date   HDL 96.40 09/05/2021   Lab Results  Component Value Date   LDLCALC 101 (H) 09/05/2021   Lab Results  Component Value Date    TRIG 83.0 09/05/2021   Lab Results  Component Value Date   CHOLHDL 2 09/05/2021   No results found for: "HGBA1C"    Assessment & Plan:  Fever, unspecified fever cause Assessment & Plan: Patient reports intermittent fever up to 102 degrees Fahrenheit, back pain, and occasional cough with phlegm. No urinary symptoms. No consistent postnasal drip. No ear congestion. -COVID, flu and chest x-ray negative. -Labs with elevated Alk phos 220, AST 73, ALT 72 and Sed reate 76,  low WBC 3.7 and platelets 148. -Consulted with Infectious Disease and Dr. Lorin Picket, recommended patient should go to ER for further evaluation. -Called and discussed the result with patient and advised to go to the ER for further evaluation.   Orders: -     POC COVID-19 BinaxNow -     POCT Influenza A/B -     CBC with Differential/Platelet -     Comprehensive metabolic panel -     Sedimentation rate -     POCT Urinalysis Dipstick (Automated) -     Urinalysis, Routine w reflex microscopic -     Urine Culture -     DG Chest 2 View    Follow-up: No follow-ups on file.   Kara Dies, NP

## 2023-06-20 NOTE — ED Triage Notes (Signed)
Pt to ED via POV with c/o fever x 10 days, pt states was seen at PCP earlier today and they did blood work.   Pt states was told by her PCP that her AST/ALT was high, and her Sed rate was high, and patient states concerns regarding her WBC being < 4.   Pt states fever is intermittent, states fever occurs at 8-9pm and again in the morning. Has been treated with OTC tylenol. Pt states initially had burning with urination, however has resolved after taking left over Bactrim. Pt A&O X4, NAD noted in triage.

## 2023-06-21 ENCOUNTER — Emergency Department
Admission: EM | Admit: 2023-06-21 | Discharge: 2023-06-21 | Disposition: A | Discharge status: Home / Self Care | Payer: BC Managed Care – PPO | Attending: Emergency Medicine | Admitting: Emergency Medicine

## 2023-06-21 ENCOUNTER — Emergency Department: Payer: BC Managed Care – PPO

## 2023-06-21 DIAGNOSIS — R509 Fever, unspecified: Secondary | ICD-10-CM

## 2023-06-21 LAB — URINE CULTURE
MICRO NUMBER:: 16086266
Result:: NO GROWTH
SPECIMEN QUALITY:: ADEQUATE

## 2023-06-21 LAB — RESP PANEL BY RT-PCR (RSV, FLU A&B, COVID)  RVPGX2
Influenza A by PCR: NEGATIVE
Influenza B by PCR: NEGATIVE
Resp Syncytial Virus by PCR: NEGATIVE
SARS Coronavirus 2 by RT PCR: NEGATIVE

## 2023-06-21 LAB — POC URINE PREG, ED: Preg Test, Ur: NEGATIVE

## 2023-06-21 LAB — HIV ANTIBODY (ROUTINE TESTING W REFLEX): HIV Screen 4th Generation wRfx: NONREACTIVE

## 2023-06-21 LAB — LACTIC ACID, PLASMA: Lactic Acid, Venous: 0.7 mmol/L (ref 0.5–1.9)

## 2023-06-21 MED ORDER — IOHEXOL 300 MG/ML  SOLN
80.0000 mL | Freq: Once | INTRAMUSCULAR | Status: AC | PRN
  Administered 2023-06-21: 80 mL via INTRAVENOUS

## 2023-06-21 MED ORDER — SODIUM CHLORIDE 0.9 % IV BOLUS (SEPSIS)
1000.0000 mL | Freq: Once | INTRAVENOUS | Status: AC
  Administered 2023-06-21: 1000 mL via INTRAVENOUS

## 2023-06-21 NOTE — Discharge Instructions (Addendum)
 You may take ibuprofen 800 mg every 6 hours as needed for fever.  Please take this medication with food.  I recommend avoiding Tylenol, alcohol at this time given your slightly elevated liver function test.  Recommend close follow-up with your PCP to have this rechecked as an outpatient.  CT scan showed no acute abnormality today.  Blood cultures, HIV pending.  If abnormal, you will be contacted.

## 2023-06-21 NOTE — Assessment & Plan Note (Addendum)
 Patient reports intermittent fever up to 102 degrees Fahrenheit, back pain, and occasional cough with phlegm. No urinary symptoms. No consistent postnasal drip. No ear congestion. -COVID, flu and chest x-ray negative. -Labs with elevated Alk phos 220, AST 73, ALT 72 and Sed reate 76,  low WBC 3.7 and platelets 148. -Consulted with Infectious Disease and Dr. Lorin Picket, recommended patient should go to ER for further evaluation. -Called and discussed the result with patient and advised to go to the ER for further evaluation.

## 2023-06-21 NOTE — ED Provider Notes (Signed)
 [facility] Provider Note    Event Date/Time   First MD Initiated Contact with Patient 06/21/23 [phone]     (approximate)   History   Fever   HPI  Heather Park is a 43 y.o. female with no significant past medical history who presents to the emergency department with complaints of fevers x 10 days.  She denies any associated headache, cough, congestion, sore throat, ear pain, vomiting with diarrhea, abdominal pain, dysuria, hematuria, vaginal bleeding or discharge, rash.  No sick contacts or recent travel.  She was seen by her primary care provider for this and was found to have elevated alkaline phosphatase, AST, ALT and ESR.  She denies any night sweats other than sweating after her fever defervesced as when she takes antipyretics.  No unexplained weight loss.  No history of malignancy.   History provided by patient.    Past Medical History:  Diagnosis Date   Breast mass 07/25/2016   palp lump left breast 4 o'clock left breast x 3 years-increases in size with periods   Dog bite of right hand 11/24/2018   GERD (gastroesophageal reflux disease)    Thoracic back pain 05/12/2018    Past Surgical History:  Procedure Laterality Date   CESAREAN SECTION     x2   CESAREAN SECTION     ESOPHAGOGASTRODUODENOSCOPY N/A 07/24/2020   Procedure: ESOPHAGOGASTRODUODENOSCOPY (EGD);  Surgeon: Toney Reil, MD;  Location: [facility] ENDOSCOPY;  Service: Gastroenterology;  Laterality: N/A;    MEDICATIONS:  Prior to Admission medications   Medication Sig Start Date End Date Taking? Authorizing Provider  b complex vitamins capsule Take 1 capsule by mouth daily.    [provider]  CALCIUM-MAGNESIUM-ZINC PO Take by mouth daily.    [provider]  etonogestrel-ethinyl estradiol (NUVARING) 0.12-0.015 MG/24HR vaginal ring USE VAGINALLY FOR THREE WEEKS THEN ONE WEEK OFF 06/01/20   [provider]  Multiple Vitamin (MULTIVITAMIN) tablet Take 1 tablet  by mouth daily.    [provider]    Physical Exam   Triage Vital Signs: ED Triage Vitals  Encounter Vitals Group     BP 06/20/23 2321 (!) 135/94     Systolic BP Percentile --      Diastolic BP Percentile --      Pulse Rate 06/20/23 2321 98     Resp 06/20/23 2321 20     Temp 06/20/23 2321 99.3 F (37.4 C)     Temp Source 06/20/23 2321 Oral     SpO2 06/20/23 2321 98 %     Weight 06/20/23 2324 125 lb 12.8 oz (57.1 kg)     Height 06/20/23 2324 5' (1.524 m)     Head Circumference --      Peak Flow --      Pain Score 06/20/23 2324 0     Pain Loc --      Pain Education --      Exclude from Growth Chart --     Most recent vital signs: Vitals:   06/21/23 0601 06/21/23 0730  BP: 100/71 (!) 100/57  Pulse: 79 98  Resp: 18 16  Temp: 98 F (36.7 C) 97.8 F (36.6 C)  SpO2: 100% 100%    CONSTITUTIONAL: Alert, responds appropriately to questions. Well-appearing; well-nourished HEAD: Normocephalic, atraumatic EYES: Conjunctivae clear, pupils appear equal, sclera nonicteric ENT: normal nose; moist mucous membranes NECK: Supple, normal ROM CARD: RRR; S1 and S2 appreciated RESP: Normal chest excursion without splinting or tachypnea; breath sounds clear and  equal bilaterally; no wheezes, no rhonchi, no rales, no hypoxia or respiratory distress, speaking full sentences ABD/GI: Non-distended; soft, non-tender, no rebound, no guarding, no peritoneal signs BACK: The back appears normal EXT: Normal ROM in all joints; no deformity noted, no edema SKIN: Normal color for age and race; warm; no rash on exposed skin NEURO: Moves all extremities equally, normal speech PSYCH: The patient's mood and manner are appropriate.   ED Results / Procedures / Treatments   LABS: (all labs ordered are listed, but only abnormal results are displayed) Labs Reviewed  CULTURE, BLOOD (ROUTINE X 2)  CULTURE, BLOOD (ROUTINE X 2)  RESP PANEL BY RT-PCR (RSV, FLU A&B, COVID)  RVPGX2  LACTIC ACID,  PLASMA  HIV ANTIBODY (ROUTINE TESTING W REFLEX)  POC URINE PREG, ED     EKG:  EKG Interpretation Date/Time:    Ventricular Rate:    PR Interval:    QRS Duration:    QT Interval:    QTC Calculation:   R Axis:      Text Interpretation:           RADIOLOGY: My personal review and interpretation of imaging: CT chest, abdomen pelvis showed no acute abnormality.  I have personally reviewed all radiology reports.   CT CHEST ABDOMEN PELVIS W CONTRAST Result Date: 06/21/2023 CLINICAL DATA:  43 year old female with history of fever for the past 10 days. EXAM: CT CHEST, ABDOMEN, AND PELVIS WITH CONTRAST TECHNIQUE: Multidetector CT imaging of the chest, abdomen and pelvis was performed following the standard protocol during bolus administration of intravenous contrast. RADIATION DOSE REDUCTION: This exam was performed according to the departmental dose-optimization program which includes automated exposure control, adjustment of the mA and/or kV according to patient size and/or use of iterative reconstruction technique. CONTRAST:  80mL OMNIPAQUE IOHEXOL 300 MG/ML  SOLN COMPARISON:  No priors. FINDINGS: CT CHEST FINDINGS Cardiovascular: Heart size is normal. There is no significant pericardial fluid, thickening or pericardial calcification. No atherosclerotic calcifications are noted in the thoracic aorta or the coronary arteries. Mediastinum/Nodes: No pathologically enlarged mediastinal or hilar lymph nodes. Esophagus is unremarkable in appearance. No axillary lymphadenopathy. Lungs/Pleura: No acute consolidative airspace disease. No pleural effusions. No suspicious appearing pulmonary nodules or masses are noted. Musculoskeletal: There are no aggressive appearing lytic or blastic lesions noted in the visualized portions of the skeleton. CT ABDOMEN PELVIS FINDINGS Hepatobiliary: No suspicious cystic or solid hepatic lesions. No intra or extrahepatic biliary ductal dilatation. Gallbladder is  unremarkable in appearance. Pancreas: No pancreatic mass. No pancreatic ductal dilatation. No pancreatic or peripancreatic fluid collections or inflammatory changes. Spleen: Unremarkable. Adrenals/Urinary Tract: Bilateral kidneys and adrenal glands are normal in appearance. No hydroureteronephrosis. Urinary bladder is unremarkable in appearance. Stomach/Bowel: The appearance of the stomach is normal. There is no pathologic dilatation of small bowel or colon. Normal appendix. Vascular/Lymphatic: No significant atherosclerotic disease, aneurysm or dissection noted in the abdominal or pelvic vasculature. No lymphadenopathy noted in the abdomen or pelvis. Reproductive: Uterus and ovaries are unremarkable in appearance. Pessary in the vagina incidentally noted. Other: No significant volume of ascites.  No pneumoperitoneum. Musculoskeletal: There are no aggressive appearing lytic or blastic lesions noted in the visualized portions of the skeleton. IMPRESSION: 1. No acute findings are noted in the chest, abdomen or pelvis to account for the patient's symptoms. Electronically Signed   By: Trudie Reed M.D.   On: 06/21/2023 08:04   DG Chest 2 View Result Date: 06/20/2023 CLINICAL DATA:  fever, fatigue EXAM: CHEST - 2 VIEW  COMPARISON:  07/04/2020 FINDINGS: The heart size and mediastinal contours are within normal limits. Both lungs are clear. The visualized skeletal structures are unremarkable. IMPRESSION: No active cardiopulmonary disease. Electronically Signed   By: Duanne Guess D.O.   On: 06/20/2023 15:47     PROCEDURES:  Critical Care performed: No     Procedures    IMPRESSION / MDM / ASSESSMENT AND PLAN / ED COURSE  I reviewed the triage vital signs and the nursing notes.    Patient here with 10 days of fever.  No other acute symptoms.  Found to have minimally elevated AST, ALT and slightly elevated ESR with her PCP.  The patient is on the cardiac monitor to evaluate for evidence of  arrhythmia and/or significant heart rate changes.   DIFFERENTIAL DIAGNOSIS (includes but not limited to):   Sepsis, pneumonia, UTI, bacteremia, viral illness, malignancy, HIV   Patient's presentation is most consistent with acute presentation with potential threat to life or bodily function.   PLAN: Reviewed workup done as an outpatient by her PCP.  Patient had a leukopenia likely suggestive of a viral illness.  AST, ALT and alkaline phosphatase elevated.  Normal total bilirubin.  She has no right upper quadrant tenderness on exam.  ESR elevated at 76.  COVID, flu negative.  Urinalysis showed no sign of infection.  Urine culture pending.  Will obtain blood cultures, lactic, test for RSV.  Also obtain HIV.  Given concern for prolonged fever, and also concern for potential malignancy.  Will obtain CT of the chest, abdomen pelvis with contrast for further evaluation.  Patient is afebrile here but states she did take ibuprofen just prior to arrival.   MEDICATIONS GIVEN IN ED: Medications  sodium chloride 0.9 % bolus 1,000 mL (0 mLs Intravenous Stopped 06/21/23 0849)  iohexol (OMNIPAQUE) 300 MG/ML solution 80 mL (80 mLs Intravenous Contrast Given 06/21/23 0750)     ED COURSE: Lactic normal.  COVID, flu and RSV negative.  Pregnancy test negative.  CT scans reviewed and interpreted by myself and the radiologist and show no acute abnormality.  Blood cultures, HIV pending.  Patient is feeling well at this time and would like discharge home.  No indication to start her on antibiotics as there is no obvious sign of bacterial infection present.  Recommended continued close follow-up with her primary care doctor.  Recommended ibuprofen as needed for fever.  Will have her avoid Tylenol, ethanol secondary to slightly elevated liver function test.  Discussed with patient that I suspect her symptoms are viral in nature and that she can have an extended viral panel done as an outpatient if warranted by her PCP but  that it would not change management in the ED today.   At this time, I do not feel there is any life-threatening condition present. I reviewed all nursing notes, vitals, pertinent previous records.  All lab and urine results, EKGs, imaging ordered have been independently reviewed and interpreted by myself.  I reviewed all available radiology reports from any imaging ordered this visit.  Based on my assessment, I feel the patient is safe to be discharged home without further emergent workup and can continue workup as an outpatient as needed. Discussed all findings, treatment plan as well as usual and customary return precautions.  They verbalize understanding and are comfortable with this plan.  Outpatient follow-up has been provided as needed.  All questions have been answered.    CONSULTS: Admission considered but given workup is reassuring, patient is hemodynamically  stable without signs or symptoms currently of sepsis, I feel she is safe for discharge which is what patient would prefer.  She will follow-up with her PCP.   OUTSIDE RECORDS REVIEWED: Reviewed recent PCP notes.       FINAL CLINICAL IMPRESSION(S) / ED DIAGNOSES   Final diagnoses:  Febrile illness     Rx / DC Orders   ED Discharge Orders     None        Note:  This document was prepared using Dragon voice recognition software and may include unintentional dictation errors.   Chauna Osoria, Layla Maw, DO 06/21/23 1208

## 2023-06-23 ENCOUNTER — Other Ambulatory Visit: Payer: Self-pay | Admitting: Nurse Practitioner

## 2023-06-23 DIAGNOSIS — R748 Abnormal levels of other serum enzymes: Secondary | ICD-10-CM

## 2023-06-23 DIAGNOSIS — R509 Fever, unspecified: Secondary | ICD-10-CM

## 2023-06-26 ENCOUNTER — Other Ambulatory Visit (INDEPENDENT_AMBULATORY_CARE_PROVIDER_SITE_OTHER): Payer: BC Managed Care – PPO

## 2023-06-26 DIAGNOSIS — R748 Abnormal levels of other serum enzymes: Secondary | ICD-10-CM | POA: Diagnosis not present

## 2023-06-26 DIAGNOSIS — R509 Fever, unspecified: Secondary | ICD-10-CM

## 2023-06-26 LAB — CULTURE, BLOOD (ROUTINE X 2)
Culture: NO GROWTH
Culture: NO GROWTH
Special Requests: ADEQUATE

## 2023-06-26 NOTE — Addendum Note (Signed)
 Addended by: Alvina Chou on: 06/26/2023 01:27 PM   Modules accepted: Orders

## 2023-06-27 ENCOUNTER — Ambulatory Visit: Payer: BC Managed Care – PPO | Admitting: Nurse Practitioner

## 2023-06-27 ENCOUNTER — Encounter: Payer: Self-pay | Admitting: Nurse Practitioner

## 2023-06-27 VITALS — BP 114/68 | HR 88 | Temp 98.0°F | Ht 60.0 in | Wt 124.2 lb

## 2023-06-27 DIAGNOSIS — R748 Abnormal levels of other serum enzymes: Secondary | ICD-10-CM

## 2023-06-27 NOTE — Progress Notes (Unsigned)
 Established Patient Office Visit  Subjective:  Patient ID: Heather Park, female    DOB: 1981/02/02  Age: 43 y.o. MRN: [id number]  CC:  Chief Complaint  Patient presents with   Follow-up   Discussed the use of a AI scribe software for clinical note transcription with the patient, who gave verbal consent to proceed.  HPI  Heather Park presents for follow-up on recent ED visit due to elevated liver enzymes and unknown fever etiology. She has experienced no fever since last Sunday. She states has improvement since the last evaluation.  At ED the CT chest abdomen pelvis normal, HIV   Bilirubin levels are normal, and Hepatitis C test is negative. She has a history of receiving the Hepatitis B vaccine and is awaiting results for Epstein-Barr Virus (EBV). EBV  HPI   Past Medical History:  Diagnosis Date   Breast mass 07/25/2016   palp lump left breast 4 o'clock left breast x 3 years-increases in size with periods   GERD (gastroesophageal reflux disease)    Thoracic back pain 05/12/2018    Past Surgical History:  Procedure Laterality Date   CESAREAN SECTION     x2   CESAREAN SECTION     ESOPHAGOGASTRODUODENOSCOPY N/A 07/24/2020   Procedure: ESOPHAGOGASTRODUODENOSCOPY (EGD);  Surgeon: Toney Reil, MD;  Location: [facility] ENDOSCOPY;  Service: Gastroenterology;  Laterality: N/A;    Family History  Problem Relation Age of Onset   Diabetes Father    Heart attack Maternal Grandmother    Breast cancer Paternal Aunt 41   Leukemia Maternal Uncle    Healthy Mother    Prostate cancer Paternal Colan Neptune' disease Sister     Social History   Socioeconomic History   Marital status: Married    Spouse name: Not on file   Number of children: Not on file   Years of education: Not on file   Highest education level: Not on file  Occupational History   Not on file  Tobacco Use   Smoking status: Never   Smokeless tobacco: Never  Vaping Use   Vaping status: Never Used   Substance and Sexual Activity   Alcohol use: Yes    Alcohol/week: 2.0 standard drinks of alcohol    Types: 2 Glasses of wine per week    Comment: socially   Drug use: No   Sexual activity: Yes    Birth control/protection: Inserts  Other Topics Concern   Not on file  Social History Narrative   Married.   2 children.   Works at [employer] as Engineer, civil (consulting).   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    Social Drivers of Corporate investment banker Strain: Not on file  Food Insecurity: No Food Insecurity (08/14/2022)   Hunger Vital Sign    Worried About Running Out of Food in the Last Year: Never true    Ran Out of Food in the Last Year: Never true  Transportation Needs: No Transportation Needs (08/14/2022)   PRAPARE - Administrator, Civil Service (Medical): No    Lack of Transportation (Non-Medical): No  Physical Activity: Not on file  Stress: Not on file  Social Connections: Not on file  Intimate Partner Violence: Not At Risk (08/14/2022)   Humiliation, Afraid, Rape, and Kick questionnaire    Fear of Current or Ex-Partner: No    Emotionally Abused: No    Physically Abused: No    Sexually Abused: No     Outpatient Medications Prior to  Visit  Medication Sig Dispense Refill   b complex vitamins capsule Take 1 capsule by mouth daily.     CALCIUM-MAGNESIUM-ZINC PO Take by mouth daily.     esomeprazole (NEXIUM) 20 MG capsule Take 1 capsule as needed by oral route.     etonogestrel-ethinyl estradiol (NUVARING) 0.12-0.015 MG/24HR vaginal ring USE VAGINALLY FOR THREE WEEKS THEN ONE WEEK OFF     Multiple Vitamin (MULTIVITAMIN) tablet Take 1 tablet by mouth daily.     Pediatric Multivitamins-Fl (MULTIVITAMIN + FLUORIDE) 0.25 MG CHEW      No facility-administered medications prior to visit.    Allergies  Allergen Reactions   Avocado Itching   Oxycodone Itching    ROS Review of Systems Negative unless indicated in HPI.    Objective:    Physical Exam Constitutional:       Appearance: Normal appearance.  HENT:     Right Ear: Tympanic membrane normal. Tympanic membrane is not erythematous.     Left Ear: Tympanic membrane normal. Tympanic membrane is not erythematous.     Nose:     Right Turbinates: Not enlarged.     Left Turbinates: Not enlarged.     Right Sinus: No maxillary sinus tenderness or frontal sinus tenderness.     Left Sinus: No maxillary sinus tenderness or frontal sinus tenderness.     Mouth/Throat:     Mouth: Mucous membranes are moist.     Pharynx: No pharyngeal swelling, oropharyngeal exudate or posterior oropharyngeal erythema.     Tonsils: No tonsillar exudate.  Cardiovascular:     Rate and Rhythm: Normal rate and regular rhythm.  Pulmonary:     Effort: Pulmonary effort is normal.     Breath sounds: Normal breath sounds. No stridor. No wheezing.  Neurological:     General: No focal deficit present.     Mental Status: She is alert and oriented to person, place, and time. Mental status is at baseline.  Psychiatric:        Mood and Affect: Mood normal.        Behavior: Behavior normal.        Thought Content: Thought content normal.        Judgment: Judgment normal.     BP 114/68   Pulse 88   Temp 98 F (36.7 C)   Ht 5' (1.524 m)   Wt 124 lb 3.2 oz (56.3 kg)   LMP 05/27/2023 (Within Days)   SpO2 99%   BMI 24.26 kg/m  Wt Readings from Last 3 Encounters:  07/02/23 123 lb 12.8 oz (56.2 kg)  06/27/23 124 lb 3.2 oz (56.3 kg)  06/20/23 125 lb 12.8 oz (57.1 kg)     Health Maintenance  Topic Date Due   COVID-19 Vaccine (1) Never done   Cervical Cancer Screening (HPV/Pap Cotest)  12/27/2022   DTaP/Tdap/Td (3 - Td or Tdap) 11/23/2028   INFLUENZA VACCINE  Completed   Hepatitis C Screening  Completed   HIV Screening  Completed   HPV VACCINES  Aged Out    There are no preventive care reminders to display for this patient.  Lab Results  Component Value Date   TSH 0.818 08/14/2022   Lab Results  Component Value Date    WBC 3.7 (L) 06/20/2023   HGB 12.7 06/20/2023   HCT 38.1 06/20/2023   MCV 78.5 06/20/2023   PLT 148.0 (L) 06/20/2023   Lab Results  Component Value Date   NA 136 06/20/2023   K 4.1 06/20/2023  CO2 29 06/20/2023   GLUCOSE 83 06/20/2023   BUN 8 06/20/2023   CREATININE 0.65 06/20/2023   BILITOT 0.2 06/26/2023   ALKPHOS 158 (H) 06/26/2023   AST 31 06/26/2023   ALT 55 (H) 06/26/2023   PROT 7.4 06/26/2023   ALBUMIN 4.0 06/26/2023   CALCIUM 8.8 06/20/2023   GFR 109.45 06/20/2023   Lab Results  Component Value Date   CHOL 214 (H) 09/05/2021   Lab Results  Component Value Date   HDL 96.40 09/05/2021   Lab Results  Component Value Date   LDLCALC 101 (H) 09/05/2021   Lab Results  Component Value Date   TRIG 83.0 09/05/2021   Lab Results  Component Value Date   CHOLHDL 2 09/05/2021   No results found for: "HGBA1C"    Assessment & Plan:  Elevated liver enzymes Assessment & Plan:  AST 31, ALT: 55 and Alk Phos : 158 improved compared to AST 73, ALT: 72 and Alk Phos : 220. No current alcohol use or overuse of NSAIDs reported. -Suggested increased water intake and avoidance of fatty foods, alcohol, and over-the-counter pain medications. -Recommended follow-up with primary care provider in a month to recheck hepatic panel.       Follow-up: Return in about 1 month (around 07/25/2023) for Follow up with PCP.   Kara Dies, NP

## 2023-06-28 LAB — EPSTEIN-BARR VIRUS (EBV) ANTIBODY PROFILE
EBV NA IgG: >600 U/mL — ABNORMAL HIGH (ref 0.0–17.9)
EBV VCA IgG: 213 U/mL — ABNORMAL HIGH (ref 0.0–17.9)
EBV VCA IgM: <36 U/mL (ref 0.0–35.9)

## 2023-06-28 LAB — HEPATIC FUNCTION PANEL
ALT: 55 IU/L — ABNORMAL HIGH (ref 0–32)
AST: 31 IU/L (ref 0–40)
Albumin: 4 g/dL (ref 3.9–4.9)
Alkaline Phosphatase: 158 IU/L — ABNORMAL HIGH (ref 44–121)
Bilirubin Total: 0.2 mg/dL (ref 0.0–1.2)
Bilirubin, Direct: 0.09 mg/dL (ref 0.00–0.40)
Total Protein: 7.4 g/dL (ref 6.0–8.5)

## 2023-06-28 LAB — ACUTE VIRAL HEPATITIS (HAV, HBV, HCV)
HCV Ab: NONREACTIVE
Hep A IgM: NEGATIVE
Hep B C IgM: NEGATIVE
Hepatitis B Surface Ag: NEGATIVE

## 2023-06-28 LAB — HCV INTERPRETATION

## 2023-06-29 ENCOUNTER — Encounter: Payer: Self-pay | Admitting: Nurse Practitioner

## 2023-07-02 ENCOUNTER — Ambulatory Visit: Payer: Self-pay | Admitting: Internal Medicine

## 2023-07-02 ENCOUNTER — Encounter: Payer: Self-pay | Admitting: Internal Medicine

## 2023-07-02 VITALS — BP 122/78 | HR 105 | Temp 98.7°F | Resp 17 | Ht 60.0 in | Wt 123.8 lb

## 2023-07-02 DIAGNOSIS — Z6824 Body mass index (BMI) 24.0-24.9, adult: Secondary | ICD-10-CM

## 2023-07-02 DIAGNOSIS — R509 Fever, unspecified: Secondary | ICD-10-CM | POA: Diagnosis not present

## 2023-07-02 NOTE — Progress Notes (Unsigned)
 Office Visit  Subjective   Patient ID: Heather Park   DOB: 1980-10-17   Age: 43 y.o.   MRN: [id number]   Chief Complaint Chief Complaint  Patient presents with   Acute Visit    New Patient     History of Present Illness The patient is a 43 yo female who comes in today with complaints of fatigue and fever.  This all started on 06/13/2023 where she felt she had a UTI where she had dysuria and mid back pain.  She took a bactrim she had but it was expired and after that she "went down hill".  She began developed fever on 06/14/2023 and neck pain.  She states she had a fever for 2 weeks but this resolved.  However, her fever returned on 06/29/2023 to 102.  She took a dose of ibuprofen and this seemed to help.  She still has left anterior/lateral neck soreness and headache on the right side of her head.  She complains of no energy and complains of fatigue and her fever will usually occur around 11PM.  She started with some joint pain in her right shoulder that started 2 weeks ago.  This is continuous and worsens first thing in morning and with a fever.  The patient has been seen by her primary care initially on 06/16/2023 and ER on 06/21/2023.   She has been seen by her PCP group multiple times.   She denied any associated cough, congestion, sore throat, ear pain, vomiting with diarrhea, abdominal pain, dysuria, hematuria, vaginal bleeding or discharge, rash.  No sick contacts or recent travel.  She was seen by her primary care provider for this and was found to have elevated alkaline phosphatase, AST, ALT and ESR.  She denies any night sweats other than sweating after her fever defervesced as when she takes antipyretics.  No unexplained weight loss.  No history of malignancy.  She had labs done on 2/14 that showed AST, ALT and alkaline phosphatase elevated.  Her ALP 220, AST 73 and ALT 72.  Her ESR was elevated at 76 as well.  They did COVID, RSV, flu testing as well as a UA which was negative.   Blood  cultures were done by the ER on 06/21/2023 and these were negative.  Hepatitis and HIV testing were negative.  The ER felt that given concern for prolonged fever, and also concern for potential malignancy.  A CXR was performed which showed no abnormalities.  They obtained CT chest/abd/pelvis 06/21/2023 that showed no acute findings are noted in the chest, abdomen or pelvis to account for the patient's symptoms.  She followed up with her PCP on 06/27/2023 and they did EBV testing which her IgG was elevated at 213 and her IgM was normal at 36.            Past Medical History Past Medical History:  Diagnosis Date   Breast mass 07/25/2016   palp lump left breast 4 o'clock left breast x 3 years-increases in size with periods   GERD (gastroesophageal reflux disease)    Thoracic back pain 05/12/2018     Allergies Allergies  Allergen Reactions   Avocado Itching   Oxycodone Itching     Medications  Current Outpatient Medications:    b complex vitamins capsule, Take 1 capsule by mouth daily., Disp: , Rfl:    CALCIUM-MAGNESIUM-ZINC PO, Take by mouth daily., Disp: , Rfl:    esomeprazole (NEXIUM) 20 MG capsule, Take 1 capsule as needed by oral  route., Disp: , Rfl:    etonogestrel-ethinyl estradiol (NUVARING) 0.12-0.015 MG/24HR vaginal ring, USE VAGINALLY FOR THREE WEEKS THEN ONE WEEK OFF, Disp: , Rfl:    Multiple Vitamin (MULTIVITAMIN) tablet, Take 1 tablet by mouth daily., Disp: , Rfl:    Review of Systems Review of Systems  Constitutional:  Positive for fever and malaise/fatigue. Negative for chills and weight loss.  Eyes:  Negative for blurred vision and double vision.  Respiratory:  Negative for cough, hemoptysis, shortness of breath and wheezing.   Cardiovascular:  Negative for chest pain, palpitations and leg swelling.  Gastrointestinal:  Negative for abdominal pain, constipation, diarrhea, heartburn, nausea and vomiting.  Genitourinary:  Negative for dysuria, frequency and hematuria.   Musculoskeletal:  Positive for joint pain. Negative for myalgias.  Skin:  Negative for itching and rash.  Neurological:  Positive for headaches. Negative for dizziness and weakness.       Objective:    Vitals BP 122/78   Pulse (!) 105   Temp 98.7 F (37.1 C)   Resp 17   Ht 5' (1.524 m)   Wt 123 lb 12.8 oz (56.2 kg)   LMP 05/27/2023 (Within Days)   SpO2 98%   BMI 24.18 kg/m    Physical Examination Physical Exam Constitutional:      Appearance: Normal appearance. She is not ill-appearing.  Neck:     Comments: She has 3 lymph nodes that enlarged on her left anterior cervical chain that are about 1 cm and are painful on palpation.  They are freely mobile.  She has a virchow's node on her left supraclavicular area. Cardiovascular:     Rate and Rhythm: Normal rate and regular rhythm.     Pulses: Normal pulses.     Heart sounds: No murmur heard.    No friction rub. No gallop.  Pulmonary:     Effort: Pulmonary effort is normal. No respiratory distress.     Breath sounds: No wheezing, rhonchi or rales.  Abdominal:     General: Bowel sounds are normal. There is no distension.     Palpations: Abdomen is soft.     Tenderness: There is no abdominal tenderness.  Musculoskeletal:     Right lower leg: No edema.     Left lower leg: No edema.  Lymphadenopathy:     Cervical: Cervical adenopathy present.  Skin:    General: Skin is warm and dry.     Findings: No rash.  Neurological:     Mental Status: She is alert.        Assessment & Plan:   No problem-specific Assessment & Plan notes found for this encounter.    No follow-ups on file.   Crist Fat, MD

## 2023-07-03 ENCOUNTER — Other Ambulatory Visit: Payer: Self-pay

## 2023-07-04 NOTE — Assessment & Plan Note (Signed)
 She is having recurrent fevers.  I spoke to ID and they recommend performing an ECHO and getting panoramic views of her teeth as she could be having an abscess or ever bacteremia from her dentition.  She also needs an ANA.  If these are normal, she needs referral to ortho.

## 2023-07-07 ENCOUNTER — Ambulatory Visit (HOSPITAL_COMMUNITY)
Admission: RE | Admit: 2023-07-07 | Discharge: 2023-07-07 | Disposition: A | Discharge status: Home / Self Care | Source: Ambulatory Visit | Attending: Internal Medicine | Admitting: Internal Medicine

## 2023-07-07 ENCOUNTER — Ambulatory Visit: Payer: Self-pay | Admitting: Internal Medicine

## 2023-07-07 DIAGNOSIS — R509 Fever, unspecified: Secondary | ICD-10-CM | POA: Diagnosis present

## 2023-07-08 ENCOUNTER — Ambulatory Visit: Payer: BC Managed Care – PPO | Admitting: Nurse Practitioner

## 2023-07-08 ENCOUNTER — Ambulatory Visit (HOSPITAL_COMMUNITY)
Admission: RE | Admit: 2023-07-08 | Discharge: 2023-07-08 | Discharge status: Home / Self Care | Source: Ambulatory Visit | Attending: Internal Medicine | Admitting: Internal Medicine

## 2023-07-08 DIAGNOSIS — R509 Fever, unspecified: Secondary | ICD-10-CM | POA: Diagnosis not present

## 2023-07-08 LAB — ECHOCARDIOGRAM COMPLETE
Area-P 1/2: 2.81 cm2
S' Lateral: 3.25 cm

## 2023-07-09 ENCOUNTER — Other Ambulatory Visit: Payer: Self-pay | Admitting: Internal Medicine

## 2023-07-09 DIAGNOSIS — R509 Fever, unspecified: Secondary | ICD-10-CM

## 2023-07-09 DIAGNOSIS — R748 Abnormal levels of other serum enzymes: Secondary | ICD-10-CM | POA: Insufficient documentation

## 2023-07-09 NOTE — Progress Notes (Signed)
 Call and tell her that her ECHO is normal as well. We are going to get her setup to see the Infectious disease doctor ASAP.  Left detailed message on voicemail

## 2023-07-09 NOTE — Assessment & Plan Note (Signed)
 AST 31, ALT: 55 and Alk Phos : 158 improved compared to AST 73, ALT: 72 and Alk Phos : 220. No current alcohol use or overuse of NSAIDs reported. -Suggested increased water intake and avoidance of fatty foods, alcohol, and over-the-counter pain medications. -Recommended follow-up with primary care provider in a month to recheck hepatic panel.

## 2023-07-09 NOTE — Progress Notes (Signed)
 Her mouth xray is normal.  Patient aware

## 2023-07-15 ENCOUNTER — Ambulatory Visit (HOSPITAL_BASED_OUTPATIENT_CLINIC_OR_DEPARTMENT_OTHER): Payer: BC Managed Care – PPO

## 2023-07-31 ENCOUNTER — Ambulatory Visit: Attending: Infectious Diseases | Admitting: Infectious Diseases

## 2023-07-31 ENCOUNTER — Encounter: Payer: Self-pay | Admitting: Infectious Diseases

## 2023-07-31 VITALS — BP 117/78 | HR 75 | Temp 98.3°F | Ht 60.0 in | Wt 123.0 lb

## 2023-07-31 DIAGNOSIS — B349 Viral infection, unspecified: Secondary | ICD-10-CM

## 2023-07-31 DIAGNOSIS — Z889 Allergy status to unspecified drugs, medicaments and biological substances status: Secondary | ICD-10-CM

## 2023-07-31 DIAGNOSIS — R509 Fever, unspecified: Secondary | ICD-10-CM | POA: Insufficient documentation

## 2023-07-31 DIAGNOSIS — Z227 Latent tuberculosis: Secondary | ICD-10-CM | POA: Diagnosis not present

## 2023-07-31 DIAGNOSIS — Z87898 Personal history of other specified conditions: Secondary | ICD-10-CM | POA: Diagnosis not present

## 2023-07-31 NOTE — Patient Instructions (Signed)
 VISIT SUMMARY:  Today, we discussed your recent febrile illness and lymphadenopathy, which you experienced in February. We reviewed your symptoms, including fever, painful lymph nodes, and elevated liver enzymes, and considered possible causes such as a viral infection or a reaction to bactrim. We also discussed your history of iron overload and a positive test for latent tuberculosis infection.  YOUR PLAN:  -FEVER OF UNKNOWN ORIGIN: You experienced a low-grade fever throughout February, peaking at 102F, primarily at night, which resolved by February 23. This could have been caused by a viral infection or a reaction to Bactrim. If your symptoms return, keep a journal of your temperature and pulse and consult your primary care physician.  -LYMPHADENOPATHY: You had painful lymph nodes on both sides of your neck following your febrile episodes, possibly due to a reaction to Bactrim or a viral infection. The lymphadenopathy has since resolved.  -ELEVATED LIVER ENZYMES: Your liver enzymes were elevated during your febrile period, likely due to a reaction to Bactrim or frequent Tylenol use or viral illness. The levels have since decreased. If needed, follow up with your primary care physician for liver function tests.  -IRON OVERLOAD: You have an elevated iron saturation ratio, but tests for hemochromatosis were negative. This condition is not currently affecting your health, but it may impact your heart in the future. Consider donating blood as advised by your oncologist.  -LATENT TUBERCULOSIS INFECTION: A positive Quantiferon Gold test five years ago indicates a latent TB infection. This means you have the bacteria in your body, but it is not active. Discuss with your primary care physician whether you need treatment for this condition.  EBV IgG positive- There is no indication to say you have an acute EBV infection  INSTRUCTIONS:  If your fever or other symptoms return, keep a journal of your  temperature and pulse and consult your primary care physician. Follow up with your primary care physician for liver function tests if needed.

## 2023-07-31 NOTE — Progress Notes (Unsigned)
 NAME: Heather Park  DOB: 1980/05/28  MRN: [id number]  Date/Time: 07/31/2023 10:55 AM  REQUESTING PROVIDER Subjective:  REASON FOR CONSULT:  ? Heather Park is a 43 y.o. with a history of   ID   Steroid/immune suppressants/splenectomy/Hardware Recent Procedure Surgery Injections Trauma Sick contacts Travel Antibiotic use Food- raw/exotic Animal bites Tick exposure Water sports Fishing/hunting/animal bird exposure Past Medical History:  Diagnosis Date   Breast mass 07/25/2016   palp lump left breast 4 o'clock left breast x 3 years-increases in size with periods   GERD (gastroesophageal reflux disease)    Thoracic back pain 05/12/2018    Past Surgical History:  Procedure Laterality Date   CESAREAN SECTION     x2   CESAREAN SECTION     ESOPHAGOGASTRODUODENOSCOPY N/A 07/24/2020   Procedure: ESOPHAGOGASTRODUODENOSCOPY (EGD);  Surgeon: Toney Reil, MD;  Location: [facility] ENDOSCOPY;  Service: Gastroenterology;  Laterality: N/A;    Social History   Socioeconomic History   Marital status: Married    Spouse name: Not on file   Number of children: Not on file   Years of education: Not on file   Highest education level: Not on file  Occupational History   Not on file  Tobacco Use   Smoking status: Never   Smokeless tobacco: Never  Vaping Use   Vaping status: Never Used  Substance and Sexual Activity   Alcohol use: Yes    Alcohol/week: 2.0 standard drinks of alcohol    Types: 2 Glasses of wine per week    Comment: socially   Drug use: No   Sexual activity: Yes    Birth control/protection: Inserts  Other Topics Concern   Not on file  Social History Narrative   Married.   2 children.   Works at [employer] as Engineer, civil (consulting).   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    Social Drivers of Corporate investment banker Strain: Not on file  Food Insecurity: No Food Insecurity (08/14/2022)   Hunger Vital Sign    Worried About Running Out of Food in the Last Year:  Never true    Ran Out of Food in the Last Year: Never true  Transportation Needs: No Transportation Needs (08/14/2022)   PRAPARE - Administrator, Civil Service (Medical): No    Lack of Transportation (Non-Medical): No  Physical Activity: Not on file  Stress: Not on file  Social Connections: Not on file  Intimate Partner Violence: Not At Risk (08/14/2022)   Humiliation, Afraid, Rape, and Kick questionnaire    Fear of Current or Ex-Partner: No    Emotionally Abused: No    Physically Abused: No    Sexually Abused: No    Family History  Problem Relation Age of Onset   Diabetes Father    Heart attack Maternal Grandmother    Breast cancer Paternal Aunt 69   Leukemia Maternal Uncle    Healthy Mother    Prostate cancer Paternal Colan Neptune' disease Sister    Allergies  Allergen Reactions   Avocado Itching   Oxycodone Itching   I? Current Outpatient Medications  Medication Sig Dispense Refill   b complex vitamins capsule Take 1 capsule by mouth daily.     CALCIUM-MAGNESIUM-ZINC PO Take by mouth daily.     esomeprazole (NEXIUM) 20 MG capsule Take 1 capsule as needed by oral route.     etonogestrel-ethinyl estradiol (NUVARING) 0.12-0.015 MG/24HR vaginal ring USE VAGINALLY FOR THREE WEEKS THEN ONE WEEK OFF  Multiple Vitamin (MULTIVITAMIN) tablet Take 1 tablet by mouth daily.     No current facility-administered medications for this visit.     Abtx:  Anti-infectives (From admission, onward)    None       REVIEW OF SYSTEMS:  Const: negative fever, negative chills, negative weight loss Eyes: negative diplopia or visual changes, negative eye pain ENT: negative coryza, negative sore throat Resp: negative cough, hemoptysis, dyspnea Cards: negative for chest pain, palpitations, lower extremity edema GU: negative for frequency, dysuria and hematuria GI: Negative for abdominal pain, diarrhea, bleeding, constipation Skin: negative for rash and pruritus Heme:  negative for easy bruising and gum/nose bleeding MS: negative for myalgias, arthralgias, back pain and muscle weakness Neurolo:negative for headaches, dizziness, vertigo, memory problems  Psych: negative for feelings of anxiety, depression  Endocrine: negative for thyroid, diabetes Allergy/Immunology- negative for any medication or food allergies ? Pertinent Positives include : Objective:  VITALS:  BP 117/78   Pulse 75   Temp 98.3 F (36.8 C) (Oral)   Ht 5' (1.524 m)   Wt 123 lb (55.8 kg)   BMI 24.02 kg/m  LDA Foley Central line Other drainage tubes PHYSICAL EXAM:  General: Alert, cooperative, no distress, appears stated age.  Head: Normocephalic, without obvious abnormality, atraumatic. Eyes: Conjunctivae clear, anicteric sclerae. Pupils are equal ENT Nares normal. No drainage or sinus tenderness. Lips, mucosa, and tongue normal. No Thrush Neck: Supple, symmetrical, no adenopathy, thyroid: non tender no carotid bruit and no JVD. Back: No CVA tenderness. Lungs: Clear to auscultation bilaterally. No Wheezing or Rhonchi. No rales. Heart: Regular rate and rhythm, no murmur, rub or gallop. Abdomen: Soft, non-tender,not distended. Bowel sounds normal. No masses Extremities: atraumatic, no cyanosis. No edema. No clubbing Skin: No rashes or lesions. Or bruising Lymph: Cervical, supraclavicular normal. Neurologic: Grossly non-focal Pertinent Labs Lab Results CBC    Component Value Date/Time   WBC 3.7 (L) 06/20/2023 1239   RBC 4.85 06/20/2023 1239   HGB 12.7 06/20/2023 1239   HCT 38.1 06/20/2023 1239   PLT 148.0 (L) 06/20/2023 1239   MCV 78.5 06/20/2023 1239   MCH 26.9 08/14/2022 1209   MCHC 33.3 06/20/2023 1239   RDW 12.8 06/20/2023 1239   LYMPHSABS 1.6 06/20/2023 1239   MONOABS 0.4 06/20/2023 1239   EOSABS 0.0 06/20/2023 1239   BASOSABS 0.0 06/20/2023 1239       Latest Ref Rng & Units 06/26/2023    1:27 PM 06/20/2023   12:39 PM 09/05/2021   10:52 AM  CMP  Glucose  70 - 99 mg/dL  83  96   BUN 6 - 23 mg/dL  8  12   Creatinine 8.29 - 1.20 mg/dL  5.62  1.30   Sodium 865 - 145 mEq/L  136  138   Potassium 3.5 - 5.1 mEq/L  4.1  4.4   Chloride 96 - 112 mEq/L  100  104   CO2 19 - 32 mEq/L  29  29   Calcium 8.4 - 10.5 mg/dL  8.8  9.1   Total Protein 6.0 - 8.5 g/dL 7.4  7.5  7.3   Total Bilirubin 0.0 - 1.2 mg/dL 0.2  0.2  0.5   Alkaline Phos 44 - 121 IU/L 158  220  40   AST 0 - 40 IU/L 31  73  20   ALT 0 - 32 IU/L 55  72  18       Microbiology: No results found for this or any previous visit (from the  past 240 hours).  IMAGING RESULTS: I have personally reviewed the films ? Impression/Recommendation ? ? ? ___I have personally spent  ---minutes involved in face-to-face and non-face-to-face activities for this patient on the day of the visit. Professional time spent includes the following activities: Preparing to see the patient (review of tests), Obtaining and/or reviewing separately obtained history (admission/discharge record), Performing a medically appropriate examination and/or evaluation , Ordering medications/tests/procedures, referring and communicating with other health care professionals, Documenting clinical information in the EMR, Independently interpreting results (not separately reported), Communicating results to the patient/family/caregiver, Counseling and educating the patient/family/caregiver and Care coordination (not separately reported).    ________________________________________________ Discussed with patient, requesting provider Note:  This document was prepared using Dragon voice recognition software and may include unintentional dictation errors.

## 2024-04-25 ENCOUNTER — Ambulatory Visit (INDEPENDENT_AMBULATORY_CARE_PROVIDER_SITE_OTHER)

## 2024-04-25 ENCOUNTER — Encounter: Payer: Self-pay | Admitting: Emergency Medicine

## 2024-04-25 ENCOUNTER — Ambulatory Visit
Admission: EM | Admit: 2024-04-25 | Discharge: 2024-04-25 | Disposition: A | Discharge status: Home / Self Care | Source: Home / Self Care

## 2024-04-25 DIAGNOSIS — M545 Low back pain, unspecified: Secondary | ICD-10-CM

## 2024-04-25 DIAGNOSIS — M542 Cervicalgia: Secondary | ICD-10-CM

## 2024-04-25 MED ORDER — METHOCARBAMOL 500 MG PO TABS: 500.0000 mg | ORAL_TABLET | Freq: Two times a day (BID) | ORAL | 0 refills | Status: AC

## 2024-04-25 MED ORDER — CELECOXIB 100 MG PO CAPS: 100.0000 mg | ORAL_CAPSULE | Freq: Two times a day (BID) | ORAL | 0 refills | Status: AC
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
## 2024-04-25 NOTE — Discharge Instructions (Addendum)
 Your pain is most likely caused by irritation to the muscles.   Take ibuprofen 600 to 800 mg every 6-8 hours consistently to help reduce inflammation and help with pain, may use Tylenol as needed  You may use muscle relaxant twice daily as needed for additional comfort be mindful this can make you drowsy  You may use heating pad in 15 minute intervals as needed for additional comfort  Begin stretching affected area daily for 10 minutes as tolerated to further loosen muscles inside of packet  When lying down place pillow underneath and between knees for support  Can try sleeping without pillow on firm mattress   Practice good posture: head back, shoulders back, chest forward, pelvis back and weight distributed evenly on both legs  If pain persist after recommended treatment or reoccurs if may be beneficial to follow up with orthopedic specialist for evaluation, this doctor specializes in the bones and can manage your symptoms long-term with options such as but not limited to imaging, medications or physical therapy

## 2024-04-26 ENCOUNTER — Ambulatory Visit (HOSPITAL_COMMUNITY): Payer: Self-pay
